# Patient Record
Sex: Female | Born: 1950 | Race: Black or African American | Hispanic: No | Smoking: Current every day smoker
Health system: Southern US, Community
[De-identification: ages and names within clinical notes are randomized; demographics above are authoritative.]

## PROBLEM LIST (undated history)

## (undated) DIAGNOSIS — F0390 Unspecified dementia without behavioral disturbance: Secondary | ICD-10-CM

## (undated) DIAGNOSIS — F419 Anxiety disorder, unspecified: Secondary | ICD-10-CM

## (undated) DIAGNOSIS — M1990 Unspecified osteoarthritis, unspecified site: Secondary | ICD-10-CM

---

## 2015-11-30 ENCOUNTER — Emergency Department (HOSPITAL_BASED_OUTPATIENT_CLINIC_OR_DEPARTMENT_OTHER)
Admission: EM | Admit: 2015-11-30 | Discharge: 2015-11-30 | Disposition: A | Discharge status: Home / Self Care | Payer: Medicare Other | Attending: Emergency Medicine | Admitting: Emergency Medicine

## 2015-11-30 ENCOUNTER — Emergency Department (HOSPITAL_BASED_OUTPATIENT_CLINIC_OR_DEPARTMENT_OTHER): Payer: Medicare Other

## 2015-11-30 ENCOUNTER — Encounter (HOSPITAL_BASED_OUTPATIENT_CLINIC_OR_DEPARTMENT_OTHER): Payer: Self-pay | Admitting: *Deleted

## 2015-11-30 DIAGNOSIS — F17210 Nicotine dependence, cigarettes, uncomplicated: Secondary | ICD-10-CM | POA: Insufficient documentation

## 2015-11-30 DIAGNOSIS — R51 Headache: Secondary | ICD-10-CM | POA: Diagnosis not present

## 2015-11-30 DIAGNOSIS — G43909 Migraine, unspecified, not intractable, without status migrainosus: Secondary | ICD-10-CM | POA: Diagnosis present

## 2015-11-30 DIAGNOSIS — R519 Headache, unspecified: Secondary | ICD-10-CM

## 2015-11-30 HISTORY — DX: Anxiety disorder, unspecified: F41.9

## 2015-11-30 HISTORY — DX: Unspecified osteoarthritis, unspecified site: M19.90

## 2015-11-30 LAB — BASIC METABOLIC PANEL
Anion gap: 6 (ref 5–15)
BUN: 7 mg/dL (ref 6–20)
CHLORIDE: 107 mmol/L (ref 101–111)
CO2: 26 mmol/L (ref 22–32)
Calcium: 8.8 mg/dL — ABNORMAL LOW (ref 8.9–10.3)
Creatinine, Ser: 0.75 mg/dL (ref 0.44–1.00)
GFR calc Af Amer: >60 mL/min (ref >60)
GFR calc non Af Amer: >60 mL/min (ref >60)
GLUCOSE: 90 mg/dL (ref 65–99)
POTASSIUM: 3.4 mmol/L — AB (ref 3.5–5.1)
Sodium: 139 mmol/L (ref 135–145)

## 2015-11-30 LAB — CBC WITH DIFFERENTIAL/PLATELET
Basophils Absolute: 0 10*3/uL (ref 0.0–0.1)
Basophils Relative: 0 %
EOS PCT: 3 %
Eosinophils Absolute: 0.1 10*3/uL (ref 0.0–0.7)
HCT: 38.9 % (ref 36.0–46.0)
Hemoglobin: 12.9 g/dL (ref 12.0–15.0)
LYMPHS ABS: 3.3 10*3/uL (ref 0.7–4.0)
LYMPHS PCT: 61 %
MCH: 33.7 pg (ref 26.0–34.0)
MCHC: 33.2 g/dL (ref 30.0–36.0)
MCV: 101.6 fL — AB (ref 78.0–100.0)
MONO ABS: 0.4 10*3/uL (ref 0.1–1.0)
MONOS PCT: 8 %
Neutro Abs: 1.5 10*3/uL — ABNORMAL LOW (ref 1.7–7.7)
Neutrophils Relative %: 28 %
PLATELETS: 275 10*3/uL (ref 150–400)
RBC: 3.83 MIL/uL — ABNORMAL LOW (ref 3.87–5.11)
RDW: 12.8 % (ref 11.5–15.5)
WBC: 5.4 10*3/uL (ref 4.0–10.5)

## 2015-11-30 LAB — SEDIMENTATION RATE: Sed Rate: 16 mm/hr (ref 0–22)

## 2015-11-30 MED ORDER — ACETAMINOPHEN 325 MG PO TABS
650.0000 mg | ORAL_TABLET | Freq: Once | ORAL | Status: DC
  Filled 2015-11-30: qty 2

## 2015-11-30 MED ORDER — IOPAMIDOL (ISOVUE-370) INJECTION 76%
100.0000 mL | Freq: Once | INTRAVENOUS | Status: AC | PRN
  Administered 2015-11-30: 100 mL via INTRAVENOUS

## 2015-11-30 NOTE — ED Provider Notes (Signed)
[facility]Y [facility] Provider Note   CSN: [id number] Arrival date & time: 11/30/15  1011  First Provider Contact:  First MD Initiated Contact with Patient 11/30/15 1042        History   Chief Complaint Chief Complaint  Patient presents with  . Migraine    HPI Christina Buck is a 65 y.o. female.  Patient is a 65 year old female with a history of arthritis and anxiety. She presents today. She states she was at church today and had a sudden knifelike pain to her right head. She states that it would grab her and then released and then grabbed her and released. She complains of some mild pain right now. She denies any vision changes. No nausea or vomiting. No syncope. No numbness or weakness to her extremities. No pain with chewing. No speech deficits or balance problems. No history of headaches in the past.    Migraine  Associated symptoms include headaches. Pertinent negatives include no chest pain, no abdominal pain and no shortness of breath.    Past Medical History:  Diagnosis Date  . Anxiety   . Arthritis     There are no active problems to display for this patient.   History reviewed. No pertinent surgical history.  OB History    No data available       Home Medications    Prior to Admission medications   Not on File    Family History History reviewed. No pertinent family history.  Social History Social History  Substance Use Topics  . Smoking status: Current Every Day Smoker    Packs/day: 0.50    Years: 25.00    Types: Cigarettes  . Smokeless tobacco: Never Used  . Alcohol use Yes     Comment: occ     Allergies   Review of patient's allergies indicates no known allergies.   Review of Systems Review of Systems  Constitutional: Negative for chills, diaphoresis, fatigue and fever.  HENT: Negative for congestion, rhinorrhea and sneezing.   Eyes: Negative.   Respiratory: Negative for cough, chest tightness and shortness of breath.     Cardiovascular: Negative for chest pain and leg swelling.  Gastrointestinal: Negative for abdominal pain, blood in stool, diarrhea, nausea and vomiting.  Genitourinary: Negative for difficulty urinating, flank pain, frequency and hematuria.  Musculoskeletal: Negative for arthralgias and back pain.  Skin: Negative for rash.  Neurological: Positive for headaches. Negative for dizziness, speech difficulty, weakness and numbness.     Physical Exam Updated Vital Signs BP 142/73   Pulse 74   Temp 98.2 F (36.8 C) (Oral)   Resp 20   Ht  (1.626 m)   Wt 140 lb (63.5 kg)   SpO2 100%   BMI 24.03 kg/m   Physical Exam  Constitutional: She is oriented to person, place, and time. She appears well-developed and well-nourished.  HENT:  Head: Normocephalic and atraumatic.  Positive tenderness over the temporal area on the right. No pain to the jaw.  Eyes: Pupils are equal, round, and reactive to light.  Neck: Normal range of motion. Neck supple.  Cardiovascular: Normal rate, regular rhythm and normal heart sounds.   Pulmonary/Chest: Effort normal and breath sounds normal. No respiratory distress. She has no wheezes. She has no rales. She exhibits no tenderness.  Abdominal: Soft. Bowel sounds are normal. There is no tenderness. There is no rebound and no guarding.  Musculoskeletal: Normal range of motion. She exhibits no edema.  Lymphadenopathy:    She has no  cervical adenopathy.  Neurological: She is alert and oriented to person, place, and time. She has normal strength. No cranial nerve deficit or sensory deficit. GCS eye subscore is 4. GCS verbal subscore is 5. GCS motor subscore is 6.  Skin: Skin is warm and dry. No rash noted.  Psychiatric: She has a normal mood and affect.     ED Treatments / Results  Labs (all labs ordered are listed, but only abnormal results are displayed) Labs Reviewed  BASIC METABOLIC PANEL - Abnormal; Notable for the following:       Result Value    Potassium 3.4 (*)    Calcium 8.8 (*)    All other components within normal limits  CBC WITH DIFFERENTIAL/PLATELET - Abnormal; Notable for the following:    RBC 3.83 (*)    MCV 101.6 (*)    Neutro Abs 1.5 (*)    All other components within normal limits  SEDIMENTATION RATE    EKG  EKG Interpretation None       Radiology Ct Angio Head W/cm &/or Wo Cm  Result Date: 11/30/2015 CLINICAL DATA:  Sharp RIGHT temporal headache this morning after turning head quickly. Symptoms have now abated. Possible confusion. No head injury. EXAM: CT ANGIOGRAPHY HEAD TECHNIQUE: Multidetector CT imaging of the head was performed using the standard protocol during bolus administration of intravenous contrast. Multiplanar CT image reconstructions and MIPs were obtained to evaluate the vascular anatomy. CONTRAST:  100 mL Isovue 370. COMPARISON:  None. FINDINGS: CT HEAD Calvarium and skull base: No fracture or destructive lesion. Mastoids and middle ears are clear. Paranasal sinuses: Imaged portions are clear. Orbits: Negative. Brain: No evidence of acute abnormality, including acute infarct, hemorrhage, hydrocephalus, or mass lesion. Subcentimeter lipoma, asymmetric over the RIGHT midbrain, incidental finding. Exuberant, but incidental falx calcification extends to the LEFT tentorium. Moderate atrophy, with prominence of the ventricles cisterns and sulci. Severe atrophy is noted involving the RIGHT greater than LEFT temporal lobes. Hypoattenuation of white matter, likely chronic microvascular ischemic change. CTA HEAD Anterior circulation: Hypoplastic/atretic RIGHT A1 ACA. Both anterior cerebrals fill from the LEFT. No significant proximal stenosis, proximal occlusion, aneurysm, or vascular malformation. Mild to moderate atheromatous change affects the M3 and M4 vessels bilaterally Posterior circulation: No significant stenosis, proximal occlusion, aneurysm, or vascular malformation. Mild moderate atheromatous change  affects the distal PCA segments. Venous sinuses: Patent Anatomic variants: LEFT fetal PCA Delayed phase:   No abnormal intracranial enhancement. IMPRESSION: No evidence for intracranial aneurysm. No acute intracranial findings. Moderate atrophy, particularly involving the RIGHT greater than LEFT temporal lobes. Electronically Signed   By: Elsie Stain M.D.   On: 11/30/2015 13:30   Procedures Procedures (including critical care time)  Medications Ordered in ED Medications  acetaminophen (TYLENOL) tablet 650 mg (650 mg Oral Not Given 11/30/15 1346)  iopamidol (ISOVUE-370) 76 % injection 100 mL (100 mLs Intravenous Contrast Given 11/30/15 1238)     Initial Impression / Assessment and Plan / ED Course  I have reviewed the triage vital signs and the nursing notes.  Pertinent labs & imaging results that were available during my care of the patient were reviewed by me and considered in my medical decision making (see chart for details).  Clinical Course    Patient presents with a headache. It was sudden onset and unilateral. Since it was concerning for possible subarachnoid hemorrhage, I did do a head CT as well as a CT angiography of the brain. This was negative. There is no evidence of subarachnoid  hemorrhage or meningitis. Her sedimentation rate is normal making it less likely to be temporal arteritis. Patient is currently asymptomatic without treatment. She is alert and oriented but has some bizarre behavior at times. She doesn't seem to be hallucinating but she loses her train of thought and starts talking about other things. I spoke with her son Link Snuffer who lives in [place]. He states that he's noticed some bizarre behavior for the last 2-3 months with intermittent confusion. I advised him to make sure that his mom follows up with her PCP to assess for signs of dementia.  Final Clinical Impressions(s) / ED Diagnoses   Final diagnoses:  Bad headache    New Prescriptions New Prescriptions    No medications on file     Rolan Bucco, MD 11/30/15 1348

## 2015-11-30 NOTE — ED Notes (Signed)
Patient transported to CT 

## 2015-11-30 NOTE — ED Triage Notes (Signed)
Per pt report ongoing headache to lt temporal area. Moderate anxiety present. No other issues.

## 2015-11-30 NOTE — ED Notes (Signed)
Pt has a flight of ideas, hard to stay focused on current problem. Moves all extemties well. Grips equal and strong. Ambulated to bathroom and back with steady gait.

## 2019-04-26 ENCOUNTER — Encounter (HOSPITAL_COMMUNITY): Payer: Self-pay

## 2019-04-26 ENCOUNTER — Other Ambulatory Visit: Payer: Self-pay

## 2019-04-26 ENCOUNTER — Inpatient Hospital Stay (HOSPITAL_COMMUNITY)
Admission: EM | Admit: 2019-04-26 | Discharge: 2019-05-01 | DRG: 177 | Disposition: A | Discharge status: Skilled Nursing Facility | Payer: Medicare Other | Source: Skilled Nursing Facility | Attending: Internal Medicine | Admitting: Internal Medicine

## 2019-04-26 ENCOUNTER — Inpatient Hospital Stay (HOSPITAL_COMMUNITY): Payer: Medicare Other

## 2019-04-26 ENCOUNTER — Emergency Department (HOSPITAL_COMMUNITY): Payer: Medicare Other

## 2019-04-26 DIAGNOSIS — D509 Iron deficiency anemia, unspecified: Secondary | ICD-10-CM | POA: Diagnosis present

## 2019-04-26 DIAGNOSIS — I351 Nonrheumatic aortic (valve) insufficiency: Secondary | ICD-10-CM | POA: Diagnosis not present

## 2019-04-26 DIAGNOSIS — U071 COVID-19: Principal | ICD-10-CM

## 2019-04-26 DIAGNOSIS — Z79899 Other long term (current) drug therapy: Secondary | ICD-10-CM | POA: Diagnosis not present

## 2019-04-26 DIAGNOSIS — Z7982 Long term (current) use of aspirin: Secondary | ICD-10-CM

## 2019-04-26 DIAGNOSIS — K219 Gastro-esophageal reflux disease without esophagitis: Secondary | ICD-10-CM | POA: Diagnosis present

## 2019-04-26 DIAGNOSIS — Z6825 Body mass index (BMI) 25.0-25.9, adult: Secondary | ICD-10-CM

## 2019-04-26 DIAGNOSIS — F0390 Unspecified dementia without behavioral disturbance: Secondary | ICD-10-CM | POA: Diagnosis present

## 2019-04-26 DIAGNOSIS — E43 Unspecified severe protein-calorie malnutrition: Secondary | ICD-10-CM | POA: Diagnosis present

## 2019-04-26 DIAGNOSIS — F419 Anxiety disorder, unspecified: Secondary | ICD-10-CM | POA: Diagnosis present

## 2019-04-26 DIAGNOSIS — J9601 Acute respiratory failure with hypoxia: Secondary | ICD-10-CM | POA: Diagnosis present

## 2019-04-26 DIAGNOSIS — I361 Nonrheumatic tricuspid (valve) insufficiency: Secondary | ICD-10-CM | POA: Diagnosis not present

## 2019-04-26 DIAGNOSIS — G9341 Metabolic encephalopathy: Secondary | ICD-10-CM | POA: Diagnosis present

## 2019-04-26 DIAGNOSIS — J1289 Other viral pneumonia: Secondary | ICD-10-CM | POA: Diagnosis present

## 2019-04-26 DIAGNOSIS — F17210 Nicotine dependence, cigarettes, uncomplicated: Secondary | ICD-10-CM | POA: Diagnosis present

## 2019-04-26 DIAGNOSIS — R945 Abnormal results of liver function studies: Secondary | ICD-10-CM

## 2019-04-26 DIAGNOSIS — I248 Other forms of acute ischemic heart disease: Secondary | ICD-10-CM | POA: Diagnosis present

## 2019-04-26 DIAGNOSIS — R590 Localized enlarged lymph nodes: Secondary | ICD-10-CM | POA: Diagnosis present

## 2019-04-26 DIAGNOSIS — E870 Hyperosmolality and hypernatremia: Secondary | ICD-10-CM | POA: Diagnosis present

## 2019-04-26 DIAGNOSIS — E876 Hypokalemia: Secondary | ICD-10-CM | POA: Diagnosis present

## 2019-04-26 HISTORY — DX: Unspecified dementia, unspecified severity, without behavioral disturbance, psychotic disturbance, mood disturbance, and anxiety: F03.90

## 2019-04-26 LAB — TROPONIN I (HIGH SENSITIVITY)
Troponin I (High Sensitivity): 19 ng/L — ABNORMAL HIGH (ref <18)
Troponin I (High Sensitivity): 19 ng/L — ABNORMAL HIGH (ref <18)

## 2019-04-26 LAB — CBC WITH DIFFERENTIAL/PLATELET
Abs Immature Granulocytes: 0.12 10*3/uL — ABNORMAL HIGH (ref 0.00–0.07)
Basophils Absolute: 0 10*3/uL (ref 0.0–0.1)
Basophils Relative: 0 %
Eosinophils Absolute: 0 10*3/uL (ref 0.0–0.5)
Eosinophils Relative: 1 %
HCT: 34.9 % — ABNORMAL LOW (ref 36.0–46.0)
Hemoglobin: 10.8 g/dL — ABNORMAL LOW (ref 12.0–15.0)
Immature Granulocytes: 2 %
Lymphocytes Relative: 13 %
Lymphs Abs: 1 10*3/uL (ref 0.7–4.0)
MCH: 31.9 pg (ref 26.0–34.0)
MCHC: 30.9 g/dL (ref 30.0–36.0)
MCV: 102.9 fL — ABNORMAL HIGH (ref 80.0–100.0)
Monocytes Absolute: 0.5 10*3/uL (ref 0.1–1.0)
Monocytes Relative: 6 %
Neutro Abs: 6.1 10*3/uL (ref 1.7–7.7)
Neutrophils Relative %: 78 %
Platelets: 394 10*3/uL (ref 150–400)
RBC: 3.39 MIL/uL — ABNORMAL LOW (ref 3.87–5.11)
RDW: 13.1 % (ref 11.5–15.5)
WBC: 7.8 10*3/uL (ref 4.0–10.5)
nRBC: 0.6 % — ABNORMAL HIGH (ref 0.0–0.2)

## 2019-04-26 LAB — COMPREHENSIVE METABOLIC PANEL
ALT: 42 U/L (ref 0–44)
AST: 59 U/L — ABNORMAL HIGH (ref 15–41)
Albumin: 3 g/dL — ABNORMAL LOW (ref 3.5–5.0)
Alkaline Phosphatase: 74 U/L (ref 38–126)
Anion gap: 13 (ref 5–15)
BUN: 18 mg/dL (ref 8–23)
CO2: 24 mmol/L (ref 22–32)
Calcium: 8.6 mg/dL — ABNORMAL LOW (ref 8.9–10.3)
Chloride: 108 mmol/L (ref 98–111)
Creatinine, Ser: 0.76 mg/dL (ref 0.44–1.00)
GFR calc Af Amer: >60 mL/min (ref >60)
GFR calc non Af Amer: >60 mL/min (ref >60)
Glucose, Bld: 108 mg/dL — ABNORMAL HIGH (ref 70–99)
Potassium: 2.9 mmol/L — ABNORMAL LOW (ref 3.5–5.1)
Sodium: 145 mmol/L (ref 135–145)
Total Bilirubin: 1.3 mg/dL — ABNORMAL HIGH (ref 0.3–1.2)
Total Protein: 6.7 g/dL (ref 6.5–8.1)

## 2019-04-26 LAB — LACTIC ACID, PLASMA: Lactic Acid, Venous: 1.1 mmol/L (ref 0.5–1.9)

## 2019-04-26 LAB — LACTATE DEHYDROGENASE: LDH: 418 U/L — ABNORMAL HIGH (ref 98–192)

## 2019-04-26 LAB — FIBRINOGEN: Fibrinogen: 704 mg/dL — ABNORMAL HIGH (ref 210–475)

## 2019-04-26 LAB — ABO/RH: ABO/RH(D): AB POS

## 2019-04-26 LAB — PROCALCITONIN: Procalcitonin: 0.16 ng/mL

## 2019-04-26 LAB — TRIGLYCERIDES: Triglycerides: 89 mg/dL (ref <150)

## 2019-04-26 LAB — D-DIMER, QUANTITATIVE: D-Dimer, Quant: 5.87 ug/mL-FEU — ABNORMAL HIGH (ref 0.00–0.50)

## 2019-04-26 LAB — FERRITIN: Ferritin: 459 ng/mL — ABNORMAL HIGH (ref 11–307)

## 2019-04-26 LAB — C-REACTIVE PROTEIN: CRP: 13.2 mg/dL — ABNORMAL HIGH (ref <1.0)

## 2019-04-26 MED ORDER — ACETAMINOPHEN 325 MG PO TABS
650.0000 mg | ORAL_TABLET | Freq: Four times a day (QID) | ORAL | Status: DC | PRN
  Administered 2019-04-29: 650 mg via ORAL
  Filled 2019-04-26: qty 2

## 2019-04-26 MED ORDER — DEXAMETHASONE SODIUM PHOSPHATE 10 MG/ML IJ SOLN
6.0000 mg | INTRAMUSCULAR | Status: DC
  Administered 2019-04-26 – 2019-04-30 (×5): 6 mg via INTRAVENOUS
  Filled 2019-04-26 (×5): qty 1

## 2019-04-26 MED ORDER — POTASSIUM CHLORIDE IN NACL 20-0.9 MEQ/L-% IV SOLN
INTRAVENOUS | Status: AC
  Filled 2019-04-26: qty 1000

## 2019-04-26 MED ORDER — ENOXAPARIN SODIUM 40 MG/0.4ML ~~LOC~~ SOLN
40.0000 mg | Freq: Every day | SUBCUTANEOUS | Status: DC
  Administered 2019-04-26: 40 mg via SUBCUTANEOUS
  Filled 2019-04-26: qty 0.4

## 2019-04-26 MED ORDER — ENSURE ENLIVE PO LIQD
237.0000 mL | Freq: Two times a day (BID) | ORAL | Status: DC
  Administered 2019-04-27 – 2019-04-30 (×6): 237 mL via ORAL

## 2019-04-26 MED ORDER — IOHEXOL 350 MG/ML SOLN
100.0000 mL | Freq: Once | INTRAVENOUS | Status: AC | PRN
  Administered 2019-04-26: 85 mL via INTRAVENOUS

## 2019-04-26 MED ORDER — POTASSIUM CHLORIDE CRYS ER 20 MEQ PO TBCR
40.0000 meq | EXTENDED_RELEASE_TABLET | Freq: Once | ORAL | Status: AC
  Administered 2019-04-26: 40 meq via ORAL
  Filled 2019-04-26: qty 2

## 2019-04-26 MED ORDER — PRO-STAT SUGAR FREE PO LIQD
30.0000 mL | Freq: Two times a day (BID) | ORAL | Status: DC
  Administered 2019-04-26 – 2019-05-01 (×5): 30 mL via ORAL
  Filled 2019-04-26 (×9): qty 30

## 2019-04-26 MED ORDER — SODIUM CHLORIDE 0.9 % IV SOLN
200.0000 mg | Freq: Once | INTRAVENOUS | Status: AC
  Administered 2019-04-26: 200 mg via INTRAVENOUS
  Filled 2019-04-26: qty 200

## 2019-04-26 MED ORDER — SODIUM CHLORIDE 0.9 % IV SOLN
100.0000 mg | Freq: Every day | INTRAVENOUS | Status: AC
  Administered 2019-04-27 – 2019-04-30 (×4): 100 mg via INTRAVENOUS
  Filled 2019-04-26 (×4): qty 100

## 2019-04-26 NOTE — ED Notes (Signed)
XR at bedside

## 2019-04-26 NOTE — H&P (Addendum)
TRH H&P    Patient Demographics:    Christina Buck, is a 68 y.o. female  MRN: [id number]  DOB - 03-Jul-1950  Admit Date - 04/26/2019  Referring MD/NP/PA:  Davonna Belling  Outpatient Primary MD for the patient is Dineen Kid, MD  Patient coming from:   [place] No  Chief complaint-  fatigue   HPI:    Christina Buck  is a 68 y.o. female,  w severe dementia apparently tested covid-19 positive about 2 weeks ago, presents with fatigue from [facility] and is hypoxic on room air to 89%  In ED,  T 97.9, P 96 R 22, Bp 120/90  Pox 91% on ra, and 89% on RA later.   CTA chest IMPRESSION: 1. Motion degraded study shows no large central pulmonary embolus. Segmental and subsegmental pulmonary arteries to the lower lobes may not be reliably evaluated due to motion artifact. 2. Extensive patchy ground-glass attenuation in both lungs, consistent with the reported clinical history of viral pneumonia. 3. Mediastinal and right hilar lymphadenopathy. Presumably reactive. Follow-up chest CT in 3 months could be used to ensure resolution.  Wbc 7.8, Hgb 10.8, Plt 394 Na 145 K 2.9 Alb 3.0 Ast 59, Alt 42, alk phos 74, T. Bili 1.3 Lactic acid 1.1 D dimer 5.87 procalcitonin 0.16 LDH 418 Fibrinogen 704  Trop 19-> 19  Urinalysis pending Blood culture x2 pending  Pt will be admitted for acute respiratory failure with hypoxia, secondary to covid-19, ? Cap       Review of systems:    In addition to the HPI above, unable to obtain due to dementia  No Fever-chills, No Headache, No changes with Vision or hearing, No problems swallowing food or Liquids, No Chest pain, Cough or Shortness of Breath, No Abdominal pain, No Nausea or Vomiting, bowel movements are regular, No Blood in stool or Urine, No dysuria, No new skin rashes or bruises, No new joints pains-aches,  No new weakness, tingling, numbness in any  extremity, No recent weight gain or loss, No polyuria, polydypsia or polyphagia, No significant Mental Stressors.  All other systems reviewed and are negative.    Past History of the following :    Past Medical History:  Diagnosis Date  . Anxiety   . Arthritis   . Dementia ([place])       History reviewed. No pertinent surgical history.    Social History:      Social History   Tobacco Use  . Smoking status: Current Every Day Smoker    Packs/day: 0.50    Years: 25.00    Pack years: 12.50    Types: Cigarettes  . Smokeless tobacco: Never Used  Substance Use Topics  . Alcohol use: Yes    Comment: occ       Family History :    No family history on file. Unable to obtain due to dementia   Home Medications:   Prior to Admission medications   Medication Sig Start Date End Date Taking? Authorizing Provider  acetaminophen (TYLENOL) 500 MG tablet Take 500  mg by mouth every 6 (six) hours as needed for pain. 02/23/18  Yes [provider]  aspirin 325 MG tablet Take 325 mg by mouth daily.   Yes [provider]  ferrous sulfate 325 (65 FE) MG EC tablet Take 325 mg by mouth daily before breakfast. 11/29/17  Yes [provider]  furosemide (LASIX) 20 MG tablet Take 20 mg by mouth daily. 08/31/18  Yes [provider]  hydrOXYzine (ATARAX/VISTARIL) 10 MG tablet Take 10 mg by mouth every 8 (eight) hours as needed for anxiety.   Yes [provider]  Melatonin 3 MG TABS Take 3 mg by mouth at bedtime. 09/06/18  Yes [provider]  omeprazole (PRILOSEC) 20 MG capsule Take 20 mg by mouth daily.   Yes [provider]  potassium chloride (KLOR-CON) 10 MEQ tablet Take 10 mEq by mouth daily. 08/31/18  Yes [provider]  sertraline (ZOLOFT) 50 MG tablet Take 50 mg by mouth daily.   Yes [provider]  traZODone (DESYREL) 50 MG tablet Take 50 mg by mouth at bedtime. 11/09/18   [provider]      Allergies:    No Known Allergies   Physical Exam:   Vitals  Blood pressure 111/75, pulse 98, temperature 97.9 F (36.6 C), temperature source Oral, resp. rate (!) 22, SpO2 96 %.  1.  General: axox1 (person)  2. Psychiatric: euthymic  3. Neurologic: Nonfocal, cn2-12 intact, reflexes 2+ symmetric, diffuse with no clonus,  Pt is able to move all 4 ext to painful stimulli  4. HEENMT:  Anicteric, pupils 1.75m symmetric, direct, consensual, near intact Neck: no jvd  5. Respiratory : Bibasilar crackles, no wheezing  6. Cardiovascular : rrr s1, s2, no m/g/r,   7. Gastrointestinal:  Abd: soft, nt, nd, +bs  8. Skin:  Ext: no c/c/e, no rash  9.Musculoskeletal:  Good ROM    Data Review:    CBC Recent Labs  Lab 04/26/19 1657  WBC 7.8  HGB 10.8*  HCT 34.9*  PLT 394  MCV 102.9*  MCH 31.9  MCHC 30.9  RDW 13.1  LYMPHSABS 1.0  MONOABS 0.5  EOSABS 0.0  BASOSABS 0.0   ------------------------------------------------------------------------------------------------------------------  Results for orders placed or performed during the hospital encounter of 04/26/19 (from the past 48 hour(s))  Lactic acid, plasma     Status: None   Collection Time: 04/26/19  4:32 PM  Result Value Ref Range   Lactic Acid, Venous 1.1 0.5 - 1.9 mmol/L    Comment: Performed at [facility] [address], [id number] Ferritin     Status: Abnormal   Collection Time: 04/26/19  4:33 PM  Result Value Ref Range   Ferritin 459 (H) 11 - 307 ng/mL    Comment: Performed at [facility] [address] [address] [place] [address] Triglycerides     Status: None   Collection Time: 04/26/19  4:33 PM  Result Value Ref Range   Triglycerides 89 <150 mg/dL    Comment: Performed at [facility] [address] [place] [address] C-reactive protein     Status: Abnormal   Collection Time: 04/26/19  4:33 PM  Result Value  Ref Range   CRP 13.2 (H) <1.0 mg/dL    Comment: Performed at [facility] [address] [place] [id number] CBC WITH DIFFERENTIAL     Status: Abnormal   Collection Time: 04/26/19  4:57  PM  Result Value Ref Range   WBC 7.8 4.0 - 10.5 K/uL   RBC 3.39 (L) 3.87 - 5.11 MIL/uL   Hemoglobin 10.8 (L) 12.0 - 15.0 g/dL   HCT 34.9 (L) 36.0 - 46.0 %   MCV 102.9 (H) 80.0 - 100.0 fL   MCH 31.9 26.0 - 34.0 pg   MCHC 30.9 30.0 - 36.0 g/dL   RDW 13.1 11.5 - 15.5 %   Platelets 394 150 - 400 K/uL   nRBC 0.6 (H) 0.0 - 0.2 %   Neutrophils Relative % 78 %   Neutro Abs 6.1 1.7 - 7.7 K/uL   Lymphocytes Relative 13 %   Lymphs Abs 1.0 0.7 - 4.0 K/uL   Monocytes Relative 6 %   Monocytes Absolute 0.5 0.1 - 1.0 K/uL   Eosinophils Relative 1 %   Eosinophils Absolute 0.0 0.0 - 0.5 K/uL   Basophils Relative 0 %   Basophils Absolute 0.0 0.0 - 0.1 K/uL   Immature Granulocytes 2 %   Abs Immature Granulocytes 0.12 (H) 0.00 - 0.07 K/uL    Comment: Performed at [facility], [place] [address] [place], [place] [address]  Comprehensive metabolic panel     Status: Abnormal   Collection Time: 04/26/19  4:57 PM  Result Value Ref Range   Sodium 145 135 - 145 mmol/L   Potassium 2.9 (L) 3.5 - 5.1 mmol/L   Chloride 108 98 - 111 mmol/L   CO2 24 22 - 32 mmol/L   Glucose, Bld 108 (H) 70 - 99 mg/dL   BUN 18 8 - 23 mg/dL   Creatinine, Ser 0.76 0.44 - 1.00 mg/dL   Calcium 8.6 (L) 8.9 - 10.3 mg/dL   Total Protein 6.7 6.5 - 8.1 g/dL   Albumin 3.0 (L) 3.5 - 5.0 g/dL   AST 59 (H) 15 - 41 U/L   ALT 42 0 - 44 U/L   Alkaline Phosphatase 74 38 - 126 U/L   Total Bilirubin 1.3 (H) 0.3 - 1.2 mg/dL   GFR calc non Af Amer >60 >60 mL/min   GFR calc Af Amer >60 >60 mL/min   Anion gap 13 5 - 15    Comment: Performed at [place], [place] [address], [place], [place] [address]  D-dimer, quantitative     Status: Abnormal   Collection Time: 04/26/19  4:57 PM  Result Value  Ref Range   D-Dimer, Quant 5.87 (H) 0.00 - 0.50 ug/mL-FEU    Comment: (NOTE) At the manufacturer cut-off of 0.50 ug/mL FEU, this assay has been documented to exclude PE with a sensitivity and negative predictive value of 97 to 99%.  At this time, this assay has not been approved by the FDA to exclude DVT/VTE. Results should be correlated with clinical presentation. Performed at [facility], [place] [address], [place], [place] [address]   Procalcitonin     Status: None   Collection Time: 04/26/19  4:57 PM  Result Value Ref Range   Procalcitonin 0.16 ng/mL    Comment:        Interpretation: PCT (Procalcitonin) <= 0.5 ng/mL: Systemic infection (sepsis) is not likely. Local bacterial infection is possible. (NOTE)       Sepsis PCT Algorithm           Lower Respiratory Tract  Infection PCT Algorithm    ----------------------------     ----------------------------         PCT < 0.25 ng/mL                PCT < 0.10 ng/mL         Strongly encourage             Strongly discourage   discontinuation of antibiotics    initiation of antibiotics    ----------------------------     -----------------------------       PCT 0.25 - 0.50 ng/mL            PCT 0.10 - 0.25 ng/mL               OR       >80% decrease in PCT            Discourage initiation of                                            antibiotics      Encourage discontinuation           of antibiotics    ----------------------------     -----------------------------         PCT >= 0.50 ng/mL              PCT 0.26 - 0.50 ng/mL               AND        <80% decrease in PCT             Encourage initiation of                                             antibiotics       Encourage continuation           of antibiotics    ----------------------------     -----------------------------        PCT >= 0.50 ng/mL                  PCT > 0.50 ng/mL               AND         increase in PCT                   Strongly encourage                                      initiation of antibiotics    Strongly encourage escalation           of antibiotics                                     -----------------------------                                           PCT <= 0.25 ng/mL  OR                                        > 80% decrease in PCT                                     Discontinue / Do not initiate                                             antibiotics Performed at [place] [address], [place], [place] [address]   Lactate dehydrogenase     Status: Abnormal   Collection Time: 04/26/19  4:57 PM  Result Value Ref Range   LDH 418 (H) 98 - 192 U/L    Comment: Performed at [facility], [place] [address] [place], [place] [address]  Fibrinogen     Status: Abnormal   Collection Time: 04/26/19  4:57 PM  Result Value Ref Range   Fibrinogen 704 (H) 210 - 475 mg/dL    Comment: Performed at [facility], [address] [place], [place] [address]    Chemistries  Recent Labs  Lab 04/26/19 1657  NA 145  K 2.9*  CL 108  CO2 24  GLUCOSE 108*  BUN 18  CREATININE 0.76  CALCIUM 8.6*  AST 59*  ALT 42  ALKPHOS 74  BILITOT 1.3*   ------------------------------------------------------------------------------------------------------------------  ------------------------------------------------------------------------------------------------------------------ GFR: CrCl cannot be calculated (Unknown ideal weight.). Liver Function Tests: Recent Labs  Lab 04/26/19 1657  AST 59*  ALT 42  ALKPHOS 74  BILITOT 1.3*  PROT 6.7  ALBUMIN 3.0*   No results for input(s): LIPASE, AMYLASE in the last 168 hours. No results for input(s): AMMONIA in the last 168 hours. Coagulation Profile: No results for input(s): INR, PROTIME in the last 168 hours. Cardiac Enzymes: No results  for input(s): CKTOTAL, CKMB, CKMBINDEX, TROPONINI in the last 168 hours. BNP (last 3 results) No results for input(s): PROBNP in the last 8760 hours. HbA1C: No results for input(s): HGBA1C in the last 72 hours. CBG: No results for input(s): GLUCAP in the last 168 hours. Lipid Profile: Recent Labs    04/26/19 1633  TRIG 89   Thyroid Function Tests: No results for input(s): TSH, T4TOTAL, FREET4, T3FREE, THYROIDAB in the last 72 hours. Anemia Panel: Recent Labs    04/26/19 1633  FERRITIN 459*    --------------------------------------------------------------------------------------------------------------- Urine analysis: No results found for: COLORURINE, APPEARANCEUR, LABSPEC, PHURINE, GLUCOSEU, HGBUR, BILIRUBINUR, KETONESUR, PROTEINUR, UROBILINOGEN, NITRITE, LEUKOCYTESUR    Imaging Results:    DG Chest Portable 1 View  Result Date: 04/26/2019 CLINICAL DATA:  Altered mental status, COVID-19, fatigue, dementia EXAM: PORTABLE CHEST 1 VIEW COMPARISON:  Portable exam 1507 hours without priors for comparison FINDINGS: Enlargement of cardiac silhouette. Atherosclerotic calcification aorta. Mediastinal contours normal. Patchy airspace infiltrates in mid to lower lungs bilaterally consistent with multifocal pneumonia and history of COVID-19. No pleural effusion or pneumothorax. Bones demineralized. IMPRESSION: Patchy BILATERAL pulmonary infiltrates at mid to lower lungs consistent with multifocal pneumonia and history of COVID-19. Mild enlargement of cardiac silhouette. Aortic Atherosclerosis (ICD10-I70.0). Electronically Signed   By: Lavonia Dana M.D.   On: 04/26/2019 15:41  ekg nsr at 95, nl axis, nl int, RBBB, no st-t changes c/w ischemia   Assessment & Plan:    Principal Problem:   Acute respiratory failure with hypoxia (HCC) Active Problems:   COVID-19 virus infection   Protein-calorie malnutrition, severe (HCC)   Hypokalemia  Acute respiratory failure with  hypoxia Secondary to covid-19 infection, vs pneumonia  Covid -19 infection covid-19 pcr Dexamethasone 28m iv qday Remdesivir pharmacy consult  Pneumonia Blood culture x2 Urine strep antigen Urine legionella antigen Rocephin 1gm iv qday, Zithromax 5053miv qday  Hypokalemia Replete Check cmp in am  Abnromal liver function Check acute hepatitis panel Check RUQ ultrasound Check cmp in am  Elevated troponin likely demand ischemia Check trop I , if trending upwards please consult cardiology Check cardiac echo  R mediastinal / hilar adenopathy Please repeat CT chest in 3 months per radiology recommendations  Severe protein calorie malnutrition Start Pro-stat 3087mo bid  Anemia Cont ferrous sulfate Check cbc in am  Gerd Cont PPI  Dementia, severe/  Anxiety Cont Zoloft 77m11m qday Cont Trazodone 77mg59mqhs  ? Edema Cont Lasix 20mg 77mqday Cont Kcl 10 meq po qday  DVT Prophylaxis-   Lovenox - SCDs   AM Labs Ordered, also please review Full Orders  Family Communication: Admission, patients condition and plan of care including tests being ordered have been discussed with the patient  who indicate understanding and agree with the plan and Code Status.  Code Status:  FULL CODE , please contact facility in [place] to verify Code status and to obtain phone number of son   I spoke with friend listed in computer, pt does have a son but she did not know his number  Admission status: Inpatient: Based on patients clinical presentation and evaluation of above clinical data, I have made determination that patient meets Inpatient criteria at this time.  Pt will require iv steroids and remdesivir for covid-19, pt will require iv abx, in case this is bacterial pnuemonia.  Pt has high risk of clinical deterioration , pt will require > 2 nites stay  Time spent in minutes : 70 minutes   Tosh Glaze Jani Graveln 04/26/2019 at 7:26 PM

## 2019-04-26 NOTE — ED Provider Notes (Signed)
[facility]-EMERGENCY DEPT Provider Note   CSN: [id number] Arrival date & time: 04/26/19  1425     History Chief Complaint  Patient presents with  . COVID+    Christina Buck is a 68 y.o. female.  HPI Level 5 caveat due to dementia. Patient sent from nursing home.  Covid +2 weeks ago.  Reportedly more fatigued.  Reported baseline dementia.  But has been more fatigued.  Patient really cannot provide much history.    Past Medical History:  Diagnosis Date  . Anxiety   . Arthritis   . Dementia (HCC)     There are no problems to display for this patient.   History reviewed. No pertinent surgical history.   OB History   No obstetric history on file.     No family history on file.  Social History   Tobacco Use  . Smoking status: Current Every Day Smoker    Packs/day: 0.50    Years: 25.00    Pack years: 12.50    Types: Cigarettes  . Smokeless tobacco: Never Used  Substance Use Topics  . Alcohol use: Yes    Comment: occ  . Drug use: No    Home Medications Prior to Admission medications   Medication Sig Start Date End Date Taking? Authorizing Provider  acetaminophen (TYLENOL) 500 MG tablet Take 500 mg by mouth every 6 (six) hours as needed for pain. 02/23/18  Yes [provider]  aspirin 325 MG tablet Take 325 mg by mouth daily.   Yes [provider]  ferrous sulfate 325 (65 FE) MG EC tablet Take 325 mg by mouth daily before breakfast. 11/29/17  Yes [provider]  furosemide (LASIX) 20 MG tablet Take 20 mg by mouth daily. 08/31/18  Yes [provider]  hydrOXYzine (ATARAX/VISTARIL) 10 MG tablet Take 10 mg by mouth every 8 (eight) hours as needed for anxiety.   Yes [provider]  Melatonin 3 MG TABS Take 3 mg by mouth at bedtime. 09/06/18  Yes [provider]  omeprazole (PRILOSEC) 20 MG capsule Take 20 mg by mouth daily.   Yes [provider]  potassium chloride (KLOR-CON) 10 MEQ  tablet Take 10 mEq by mouth daily. 08/31/18  Yes [provider]  sertraline (ZOLOFT) 50 MG tablet Take 50 mg by mouth daily.   Yes [provider]  traZODone (DESYREL) 50 MG tablet Take 50 mg by mouth at bedtime. 11/09/18   [provider]    Allergies    Patient has no known allergies.  Review of Systems   Review of Systems  Unable to perform ROS: Dementia    Physical Exam Updated Vital Signs BP 115/67   Pulse 98   Temp 97.9 F (36.6 C) (Oral)   Resp (!) 24   SpO2 92%   Physical Exam Vitals and nursing note reviewed.  Constitutional:      Comments: Sitting in bed with eyes closed.  Will arouse somewhat but really unable to answer questions.  HENT:     Mouth/Throat:     Mouth: Mucous membranes are moist.  Cardiovascular:     Rate and Rhythm: Regular rhythm.  Pulmonary:     Comments: Mildly harsh breath sounds.  Tachypnea. Abdominal:     Tenderness: There is no abdominal tenderness.  Musculoskeletal:     Cervical back: Neck supple.     Right lower leg: No edema.     Left lower leg: No edema.  Skin:  General: Skin is warm.     Capillary Refill: Capillary refill takes less than 2 seconds.  Neurological:     Comments: Awake and will look to me but really not answer questions or follow commands.     ED Results / Procedures / Treatments   Labs (all labs ordered are listed, but only abnormal results are displayed) Labs Reviewed  CBC WITH DIFFERENTIAL/PLATELET - Abnormal; Notable for the following components:      Result Value   RBC 3.39 (*)    Hemoglobin 10.8 (*)    HCT 34.9 (*)    MCV 102.9 (*)    nRBC 0.6 (*)    Abs Immature Granulocytes 0.12 (*)    All other components within normal limits  COMPREHENSIVE METABOLIC PANEL - Abnormal; Notable for the following components:   Potassium 2.9 (*)    Glucose, Bld 108 (*)    Calcium 8.6 (*)    Albumin 3.0 (*)    AST 59 (*)    Total Bilirubin 1.3 (*)    All other components within normal  limits  D-DIMER, QUANTITATIVE (NOT AT [facility]) - Abnormal; Notable for the following components:   D-Dimer, Quant 5.87 (*)    All other components within normal limits  LACTATE DEHYDROGENASE - Abnormal; Notable for the following components:   LDH 418 (*)    All other components within normal limits  FERRITIN - Abnormal; Notable for the following components:   Ferritin 459 (*)    All other components within normal limits  FIBRINOGEN - Abnormal; Notable for the following components:   Fibrinogen 704 (*)    All other components within normal limits  C-REACTIVE PROTEIN - Abnormal; Notable for the following components:   CRP 13.2 (*)    All other components within normal limits  CULTURE, BLOOD (ROUTINE X 2)  CULTURE, BLOOD (ROUTINE X 2)  LACTIC ACID, PLASMA  PROCALCITONIN  TRIGLYCERIDES  URINALYSIS, ROUTINE W REFLEX MICROSCOPIC    EKG EKG Interpretation  Date/Time:  Thursday April 26 2019 14:46:03 EST Ventricular Rate:  97 PR Interval:    QRS Duration: 154 QT Interval:  396 QTC Calculation: 504 R Axis:   30 Text Interpretation: Sinus rhythm Right bundle branch block Baseline wander in lead(s) V2 Confirmed by Davonna Belling [id number]) on 04/26/2019 6:55:43 PM   Radiology DG Chest Portable 1 View  Result Date: 04/26/2019 CLINICAL DATA:  Altered mental status, COVID-19, fatigue, dementia EXAM: PORTABLE CHEST 1 VIEW COMPARISON:  Portable exam 1507 hours without priors for comparison FINDINGS: Enlargement of cardiac silhouette. Atherosclerotic calcification aorta. Mediastinal contours normal. Patchy airspace infiltrates in mid to lower lungs bilaterally consistent with multifocal pneumonia and history of COVID-19. No pleural effusion or pneumothorax. Bones demineralized. IMPRESSION: Patchy BILATERAL pulmonary infiltrates at mid to lower lungs consistent with multifocal pneumonia and history of COVID-19. Mild enlargement of cardiac silhouette. Aortic Atherosclerosis (ICD10-I70.0).  Electronically Signed   By: Lavonia Dana M.D.   On: 04/26/2019 15:41    Procedures Procedures (including critical care time)  Medications Ordered in ED Medications - No data to display  ED Course  I have reviewed the triage vital signs and the nursing notes.  Pertinent labs & imaging results that were available during my care of the patient were reviewed by me and considered in my medical decision making (see chart for details).    MDM Rules/Calculators/A&P                      Patient with  shortness of breath.  X-ray shows Covid-like pneumonia.  Known Covid infection.  Sats 90% on room air.  Requires oxygen.  Will admit to hospitalist Final Clinical Impression(s) / ED Diagnoses Final diagnoses:  COVID-19    Rx / DC Orders ED Discharge Orders    None       Benjiman CorePickering, Randal Goens, MD 04/26/19 1858

## 2019-04-26 NOTE — ED Triage Notes (Addendum)
Pt was dx with COVID 2 weeks ago. Christina Buck's states that pt is more fatigued than normal, but pt is awake and alert and singing. Pt is at baseline mentation (oriented to self only).

## 2019-04-26 NOTE — ED Notes (Signed)
Pt placed on 2L [place]

## 2019-04-27 ENCOUNTER — Inpatient Hospital Stay (HOSPITAL_COMMUNITY): Payer: Medicare Other

## 2019-04-27 DIAGNOSIS — J9601 Acute respiratory failure with hypoxia: Secondary | ICD-10-CM

## 2019-04-27 DIAGNOSIS — I361 Nonrheumatic tricuspid (valve) insufficiency: Secondary | ICD-10-CM

## 2019-04-27 DIAGNOSIS — I351 Nonrheumatic aortic (valve) insufficiency: Secondary | ICD-10-CM

## 2019-04-27 LAB — CBC WITH DIFFERENTIAL/PLATELET
Abs Immature Granulocytes: 0.12 10*3/uL — ABNORMAL HIGH (ref 0.00–0.07)
Basophils Absolute: 0 10*3/uL (ref 0.0–0.1)
Basophils Relative: 0 %
Eosinophils Absolute: 0 10*3/uL (ref 0.0–0.5)
Eosinophils Relative: 0 %
HCT: 33.8 % — ABNORMAL LOW (ref 36.0–46.0)
Hemoglobin: 10.3 g/dL — ABNORMAL LOW (ref 12.0–15.0)
Immature Granulocytes: 2 %
Lymphocytes Relative: 13 %
Lymphs Abs: 0.8 10*3/uL (ref 0.7–4.0)
MCH: 32.2 pg (ref 26.0–34.0)
MCHC: 30.5 g/dL (ref 30.0–36.0)
MCV: 105.6 fL — ABNORMAL HIGH (ref 80.0–100.0)
Monocytes Absolute: 0.2 10*3/uL (ref 0.1–1.0)
Monocytes Relative: 4 %
Neutro Abs: 4.7 10*3/uL (ref 1.7–7.7)
Neutrophils Relative %: 81 %
Platelets: 375 10*3/uL (ref 150–400)
RBC: 3.2 MIL/uL — ABNORMAL LOW (ref 3.87–5.11)
RDW: 13.2 % (ref 11.5–15.5)
WBC: 5.8 10*3/uL (ref 4.0–10.5)
nRBC: 0.7 % — ABNORMAL HIGH (ref 0.0–0.2)

## 2019-04-27 LAB — COMPREHENSIVE METABOLIC PANEL
ALT: 48 U/L — ABNORMAL HIGH (ref 0–44)
AST: 67 U/L — ABNORMAL HIGH (ref 15–41)
Albumin: 3 g/dL — ABNORMAL LOW (ref 3.5–5.0)
Alkaline Phosphatase: 68 U/L (ref 38–126)
Anion gap: 11 (ref 5–15)
BUN: 18 mg/dL (ref 8–23)
CO2: 23 mmol/L (ref 22–32)
Calcium: 8.3 mg/dL — ABNORMAL LOW (ref 8.9–10.3)
Chloride: 115 mmol/L — ABNORMAL HIGH (ref 98–111)
Creatinine, Ser: 0.69 mg/dL (ref 0.44–1.00)
GFR calc Af Amer: >60 mL/min (ref >60)
GFR calc non Af Amer: >60 mL/min (ref >60)
Glucose, Bld: 120 mg/dL — ABNORMAL HIGH (ref 70–99)
Potassium: 3.9 mmol/L (ref 3.5–5.1)
Sodium: 149 mmol/L — ABNORMAL HIGH (ref 135–145)
Total Bilirubin: 1.2 mg/dL (ref 0.3–1.2)
Total Protein: 6.7 g/dL (ref 6.5–8.1)

## 2019-04-27 LAB — URINALYSIS, ROUTINE W REFLEX MICROSCOPIC
Bilirubin Urine: NEGATIVE
Glucose, UA: NEGATIVE mg/dL
Hgb urine dipstick: NEGATIVE
Ketones, ur: 5 mg/dL — AB
Nitrite: NEGATIVE
Protein, ur: 30 mg/dL — AB
Specific Gravity, Urine: >1.046 — ABNORMAL HIGH (ref 1.005–1.030)
pH: 5 (ref 5.0–8.0)

## 2019-04-27 LAB — HEPATITIS PANEL, ACUTE
HCV Ab: NONREACTIVE
Hep A IgM: NONREACTIVE
Hep B C IgM: NONREACTIVE
Hepatitis B Surface Ag: NONREACTIVE

## 2019-04-27 LAB — CK TOTAL AND CKMB (NOT AT ARMC)
CK, MB: 7 ng/mL — ABNORMAL HIGH (ref 0.5–5.0)
Relative Index: 1.1 (ref 0.0–2.5)
Total CK: 627 U/L — ABNORMAL HIGH (ref 38–234)

## 2019-04-27 LAB — ECHOCARDIOGRAM COMPLETE
Height: 64 in
Weight: 2356.28 oz

## 2019-04-27 LAB — HIV ANTIBODY (ROUTINE TESTING W REFLEX): HIV Screen 4th Generation wRfx: NONREACTIVE

## 2019-04-27 LAB — SARS CORONAVIRUS 2 (TAT 6-24 HRS): SARS Coronavirus 2: POSITIVE — AB

## 2019-04-27 LAB — STREP PNEUMONIAE URINARY ANTIGEN: Strep Pneumo Urinary Antigen: NEGATIVE

## 2019-04-27 LAB — TROPONIN I (HIGH SENSITIVITY): Troponin I (High Sensitivity): 17 ng/L (ref <18)

## 2019-04-27 MED ORDER — SODIUM CHLORIDE 0.9 % IV SOLN
500.0000 mg | Freq: Every day | INTRAVENOUS | Status: DC
  Administered 2019-04-27 – 2019-04-30 (×4): 500 mg via INTRAVENOUS
  Filled 2019-04-27 (×4): qty 500

## 2019-04-27 MED ORDER — FERROUS SULFATE 325 (65 FE) MG PO TABS
325.0000 mg | ORAL_TABLET | Freq: Every day | ORAL | Status: DC
  Administered 2019-04-27 – 2019-05-01 (×3): 325 mg via ORAL
  Filled 2019-04-27 (×5): qty 1

## 2019-04-27 MED ORDER — PANTOPRAZOLE SODIUM 40 MG PO TBEC
40.0000 mg | DELAYED_RELEASE_TABLET | Freq: Every day | ORAL | Status: DC
  Administered 2019-04-27 – 2019-05-01 (×4): 40 mg via ORAL
  Filled 2019-04-27 (×5): qty 1

## 2019-04-27 MED ORDER — ENOXAPARIN SODIUM 40 MG/0.4ML ~~LOC~~ SOLN
40.0000 mg | Freq: Two times a day (BID) | SUBCUTANEOUS | Status: DC
  Administered 2019-04-27 – 2019-05-01 (×8): 40 mg via SUBCUTANEOUS
  Filled 2019-04-27 (×8): qty 0.4

## 2019-04-27 MED ORDER — MELATONIN 3 MG PO TABS
3.0000 mg | ORAL_TABLET | Freq: Every day | ORAL | Status: DC
  Administered 2019-04-27 – 2019-04-29 (×2): 3 mg via ORAL
  Filled 2019-04-27 (×4): qty 1

## 2019-04-27 MED ORDER — SERTRALINE HCL 50 MG PO TABS
50.0000 mg | ORAL_TABLET | Freq: Every day | ORAL | Status: DC
  Administered 2019-04-27 – 2019-05-01 (×4): 50 mg via ORAL
  Filled 2019-04-27 (×4): qty 1

## 2019-04-27 MED ORDER — ASPIRIN 325 MG PO TABS
325.0000 mg | ORAL_TABLET | Freq: Every day | ORAL | Status: DC
  Administered 2019-04-27 – 2019-05-01 (×4): 325 mg via ORAL
  Filled 2019-04-27 (×4): qty 1

## 2019-04-27 MED ORDER — SODIUM CHLORIDE 0.9 % IV SOLN
1.0000 g | Freq: Every day | INTRAVENOUS | Status: DC
  Administered 2019-04-27 – 2019-04-30 (×5): 1 g via INTRAVENOUS
  Filled 2019-04-27 (×2): qty 1
  Filled 2019-04-27: qty 10
  Filled 2019-04-27 (×3): qty 1

## 2019-04-27 MED ORDER — HYDROXYZINE HCL 10 MG PO TABS
10.0000 mg | ORAL_TABLET | Freq: Three times a day (TID) | ORAL | Status: DC | PRN
  Administered 2019-04-28 – 2019-05-01 (×5): 10 mg via ORAL
  Filled 2019-04-27 (×6): qty 1

## 2019-04-27 MED ORDER — FUROSEMIDE 20 MG PO TABS: 20.0000 mg | ORAL_TABLET | Freq: Every day | ORAL | Status: DC

## 2019-04-27 MED ORDER — POTASSIUM CHLORIDE CRYS ER 10 MEQ PO TBCR
10.0000 meq | EXTENDED_RELEASE_TABLET | Freq: Every day | ORAL | Status: DC
  Administered 2019-04-27 – 2019-05-01 (×4): 10 meq via ORAL
  Filled 2019-04-27 (×4): qty 1

## 2019-04-27 MED ORDER — TRAZODONE HCL 50 MG PO TABS
50.0000 mg | ORAL_TABLET | Freq: Every day | ORAL | Status: DC
  Administered 2019-04-27 – 2019-04-29 (×2): 50 mg via ORAL
  Filled 2019-04-27 (×4): qty 1

## 2019-04-27 MED ORDER — DEXTROSE-NACL 5-0.45 % IV SOLN: INTRAVENOUS | Status: DC

## 2019-04-27 NOTE — Progress Notes (Signed)
This RN called patient's emergency contact, Kyra Manges (pt's friend), to obtain baseline history of patient. Kyra Manges stated she had not seen pt in 2 years and did not have son's or any other family's information. Also attempted to call [facility] facility to get son's information. No answer after multiple attempts.

## 2019-04-27 NOTE — Progress Notes (Signed)
PROGRESS NOTE  Ninfa Geophysicist/field seismologist  DOB: [birth year]  PCP: Dineen Kid, MD WNI:[id number]  DOA: 04/26/2019  LOS: 1 day   Chief Complaint  Patient presents with  . COVID+   Brief narrative: Christina Buck is a 68 y.o. female,  with severe dementia apparently who tested covid-19 positive about 2 weeks prior to presentation. Patient presented to the ED on 12/24 with fatigue from [facility]. In the ED, patient was 89% on room air, afebrile.  COVID-19 PCR positive. CTA chest showed extensive patchy ground-glass attenuation in both lungs, consistent with the reported clinical history of viral pneumonia. Blood work showed WBC normal at 7.8, lactic acid normal at 1.1, procalcitonin 0.16 Pt was admitted for acute respiratory failure with hypoxia, secondary to covid-19  Subjective: Patient was seen and examined this morning.  Pleasant elderly African-American female.  Seems to have baseline dementia.  She was cheerful but not able to answer questions to me.  Not in physical distress.  Assessment/Plan: COVID pneumonia Acute respiratory failure with hypoxia -Presented with fatigue, hypoxia -CTA chest showed extensive patchy groundglass attenuation in both lungs -Treatment: 10-day course of IV dexamethasone 6 mg and 5-day course of IV remdesivir today none 12/28  -Supportive care: Vitamin C, Zinc, inhalers, Tylenol, Antitussives -benzonatate, Mucinex -Oxygen -not on supplemental oxygen at home.  89% room air on arrival.  Currently on low-flow supplemental oxygen. -Labs and biomarker trend as below.  Lab Results  Component Value Date   SARSCOV2NAA POSITIVE (A) 04/26/2019    Recent Labs  Lab 04/26/19 1657 04/27/19 0328  WBC 7.8 5.8   Recent Labs    04/26/19 1633 04/26/19 1657  DDIMER  --  5.87*  FERRITIN 459*  --   LDH  --  418*  CRP 13.2*  --    Community-acquired pneumonia -Initial procalcitonin level on admission was slightly elevated at 0.16. -With suspicion of secondary  bacterial infection, patient was started on Rocephin 1gm iv qday, Zithromax 500mg  iv qday which we will continue.  Acute hypernatremia -sodium level elevated to 149, chloride level elevated to 115. -I switched her IV fluids to D5 half NS this morning.  Repeat labs tomorrow.  Acute metabolic encephalopathy Dementia, severe/  Anxiety -Patient is cheerful but not able to answer any question appropriately.  She seems very confused.  She has a history of dementia.  Additionally, acutely she has Covid pneumonia as well as hyponatremia.  Expect some improvement in mental status back to baseline.  For now cont Zoloft 50mg  po qday Cont Trazodone 50mg  po qhs  Abnromal liver function Negative acute hepatitis panel Normal RUQ ultrasound Repeat cmp in am  Elevated troponin  -Only slightly elevated troponin.  Likely 1 ischemia.  Echo with EF 60 to 65%, no WMA  R mediastinal / hilar adenopathy Please repeat CT chest in 3 months per radiology recommendations  Severe protein calorie malnutrition Start Pro-stat 53mL po bid  Chronic anemia Cont ferrous sulfate Repeat cbc in am  GERD Cont PPI  ? Peripheral edema Cont Lasix 20mg   Po qday Cont Kcl 10 meq po qday  Mobility: Encourage ambulation.  PT eval ordered. Diet: Dysphagia 1 diet.  Speech therapy eval Fluid: D5 half NS at 50 mL/h DVT prophylaxis:  Lovenox subcu Code Status:  Full code Family Communication:  Not at bedside Expected Discharge:  Remdesivir to continue till 12/28.  Consultants:  None  Procedures:  None  Antimicrobials: Anti-infectives (From admission, onward)   Start     Dose/Rate Route Frequency Ordered Stop  04/27/19 1000  remdesivir 100 mg in sodium chloride 0.9 % 100 mL IVPB     100 mg 200 mL/hr over 30 Minutes Intravenous Daily 04/26/19 1919 05/01/19 0959   04/27/19 0200  azithromycin (ZITHROMAX) 500 mg in sodium chloride 0.9 % 250 mL IVPB     500 mg 250 mL/hr over 60 Minutes Intravenous Daily  04/27/19 0052     04/27/19 0100  cefTRIAXone (ROCEPHIN) 1 g in sodium chloride 0.9 % 100 mL IVPB     1 g 200 mL/hr over 30 Minutes Intravenous Daily at bedtime 04/27/19 0052     04/26/19 2000  remdesivir 200 mg in sodium chloride 0.9% 250 mL IVPB     200 mg 580 mL/hr over 30 Minutes Intravenous Once 04/26/19 1919 04/26/19 2111        Code Status: Full Code   Diet Order            DIET - DYS 1 Room service appropriate? Yes; Fluid consistency: Thin  Diet effective now              Infusions:  . azithromycin Stopped (04/27/19 0404)  . cefTRIAXone (ROCEPHIN)  IV Stopped (04/27/19 0218)  . dextrose 5 % and 0.45% NaCl 50 mL/hr at 04/27/19 0953  . remdesivir 100 mg in NS 100 mL 100 mg (04/27/19 0912)    Scheduled Meds: . aspirin  325 mg Oral Daily  . dexamethasone (DECADRON) injection  6 mg Intravenous Q24H  . enoxaparin (LOVENOX) injection  40 mg Subcutaneous Q12H  . feeding supplement (ENSURE ENLIVE)  237 mL Oral BID BM  . feeding supplement (PRO-STAT SUGAR FREE 64)  30 mL Oral BID  . ferrous sulfate  325 mg Oral QAC breakfast  . Melatonin  3 mg Oral QHS  . pantoprazole  40 mg Oral Daily  . potassium chloride  10 mEq Oral Daily  . sertraline  50 mg Oral Daily  . traZODone  50 mg Oral QHS    PRN meds: acetaminophen, hydrOXYzine   Objective: Vitals:   04/27/19 0920 04/27/19 1604  BP: 126/88 125/71  Pulse: 90 85  Resp: 20 20  Temp: 98.6 F (37 C) 97.9 F (36.6 C)  SpO2: 92% 100%    Intake/Output Summary (Last 24 hours) at 04/27/2019 1651 Last data filed at 04/27/2019 1600 Gross per 24 hour  Intake 1235 ml  Output 600 ml  Net 635 ml   Filed Weights   04/26/19 2241  Weight: 66.8 kg   Weight change:  Body mass index is 25.28 kg/m.   Physical Exam: General exam: Appears calm and comfortable.  Skin: No rashes, lesions or ulcers. HEENT: Atraumatic, normocephalic, supple neck, no obvious bleeding Lungs: Clear to auscultation bilaterally CVS: Regular rate  and rhythm, no murmur GI/Abd soft, nontender, nondistended, bowel sound present CNS: Alert, awake, cheerful, demented, able to follow motor commands Psychiatry: Mood appropriate Extremities: No pedal edema, no calf tenderness  Data Review: I have personally reviewed the laboratory data and studies available.  Recent Labs  Lab 04/26/19 1657 04/27/19 0328  WBC 7.8 5.8  NEUTROABS 6.1 PENDING  HGB 10.8* 10.3*  HCT 34.9* 33.8*  MCV 102.9* 105.6*  PLT 394 375   Recent Labs  Lab 04/26/19 1657 04/27/19 0328  NA 145 149*  K 2.9* 3.9  CL 108 115*  CO2 24 23  GLUCOSE 108* 120*  BUN 18 18  CREATININE 0.76 0.69  CALCIUM 8.6* 8.3*    Lorin Glass, MD  [facility] 04/27/2019

## 2019-04-27 NOTE — Progress Notes (Signed)
Bladder scan done at 1315 showing 192 ml in bladder. Pt does not appear uncomfortable and bladder does not arrear to be distended. I&O cath done at 0550 per previous shift. Pt did have a large incontinent BM at 1100 and could have voided a small amount then also. MD Dahal made aware of findings- will continue to monitor UOP closely.   Also, pt is a total care patient and requires extensive coaching and patience with feedings. Pt packs food frequently and takes a very long to swallow. Pt also forgets how to swallow and use a straw- RN used the bottom end of the straw to place a few drops of liquid in her mouth at a time. Pt did swallow her pills whole in AS. MD Dahal made aware- diet changed to puree (Dys 1) for now and ST Eval ordered.

## 2019-04-27 NOTE — Progress Notes (Signed)
Unable to answer all admission questions. Pt has Dementia and only oriented to self.

## 2019-04-27 NOTE — Progress Notes (Signed)
Second bladder scan done at 1600 showing only 89 ml. Pt had voided close to 200 ml in purewick cannister- some leaking incontinence also. Will continue to monitor.

## 2019-04-27 NOTE — Progress Notes (Signed)
  Echocardiogram 2D Echocardiogram has been performed.  Christina Buck 04/27/2019, 8:46 AM

## 2019-04-28 LAB — COMPREHENSIVE METABOLIC PANEL
ALT: 41 U/L (ref 0–44)
AST: 48 U/L — ABNORMAL HIGH (ref 15–41)
Albumin: 2.5 g/dL — ABNORMAL LOW (ref 3.5–5.0)
Alkaline Phosphatase: 65 U/L (ref 38–126)
Anion gap: 13 (ref 5–15)
BUN: 22 mg/dL (ref 8–23)
CO2: 23 mmol/L (ref 22–32)
Calcium: 8.2 mg/dL — ABNORMAL LOW (ref 8.9–10.3)
Chloride: 116 mmol/L — ABNORMAL HIGH (ref 98–111)
Creatinine, Ser: 0.73 mg/dL (ref 0.44–1.00)
GFR calc Af Amer: >60 mL/min (ref >60)
GFR calc non Af Amer: >60 mL/min (ref >60)
Glucose, Bld: 137 mg/dL — ABNORMAL HIGH (ref 70–99)
Potassium: 3.8 mmol/L (ref 3.5–5.1)
Sodium: 152 mmol/L — ABNORMAL HIGH (ref 135–145)
Total Bilirubin: 0.7 mg/dL (ref 0.3–1.2)
Total Protein: 6.1 g/dL — ABNORMAL LOW (ref 6.5–8.1)

## 2019-04-28 LAB — CBC WITH DIFFERENTIAL/PLATELET
Abs Immature Granulocytes: 0.15 10*3/uL — ABNORMAL HIGH (ref 0.00–0.07)
Basophils Absolute: 0 10*3/uL (ref 0.0–0.1)
Basophils Relative: 0 %
Eosinophils Absolute: 0 10*3/uL (ref 0.0–0.5)
Eosinophils Relative: 0 %
HCT: 32.6 % — ABNORMAL LOW (ref 36.0–46.0)
Hemoglobin: 9.9 g/dL — ABNORMAL LOW (ref 12.0–15.0)
Immature Granulocytes: 2 %
Lymphocytes Relative: 13 %
Lymphs Abs: 0.9 10*3/uL (ref 0.7–4.0)
MCH: 32.1 pg (ref 26.0–34.0)
MCHC: 30.4 g/dL (ref 30.0–36.0)
MCV: 105.8 fL — ABNORMAL HIGH (ref 80.0–100.0)
Monocytes Absolute: 0.3 10*3/uL (ref 0.1–1.0)
Monocytes Relative: 5 %
Neutro Abs: 5.3 10*3/uL (ref 1.7–7.7)
Neutrophils Relative %: 80 %
Platelets: 398 10*3/uL (ref 150–400)
RBC: 3.08 MIL/uL — ABNORMAL LOW (ref 3.87–5.11)
RDW: 13.4 % (ref 11.5–15.5)
WBC: 6.7 10*3/uL (ref 4.0–10.5)
nRBC: 0.6 % — ABNORMAL HIGH (ref 0.0–0.2)

## 2019-04-28 MED ORDER — LIP MEDEX EX OINT
TOPICAL_OINTMENT | CUTANEOUS | Status: AC
  Filled 2019-04-28: qty 7

## 2019-04-28 MED ORDER — DEXTROSE 5 % IV SOLN: INTRAVENOUS | Status: DC

## 2019-04-28 NOTE — Progress Notes (Signed)
Spoke with patient son, phone number updated in chart

## 2019-04-28 NOTE — Evaluation (Signed)
Clinical/Bedside Swallow Evaluation Patient Details  Name: Christina Buck MRN: [id number] Date of Birth: 19-Sep-1950  Today's Date: 04/28/2019 Time: SLP Start Time (ACUTE ONLY): 1725 SLP Stop Time (ACUTE ONLY): 1745 SLP Time Calculation (min) (ACUTE ONLY): 20 min  Past Medical History:  Past Medical History:  Diagnosis Date  . Anxiety   . Arthritis   . Dementia [facility])    Past Surgical History: History reviewed. No pertinent surgical history. HPI:      Assessment / Plan / Recommendation Clinical Impression  Patient presents with what appears to be a primarily cognitive-based dysphagia in light of premorbid diagnosis of advanced dementia. She exhibited fluctuating attention to and acceptance of boluses, and delays in oral transit and swallow initiation, but no overt s/s of aspiration or penetration. She was able to hold cup and give self sips. Patient is at high risk for malnutrition and dehydration and unfortunately, prognosis for her swallow function to improve is poor and she appears to be in the end stages of dementia. SLP Visit Diagnosis: Dysphagia, unspecified (R13.10)    Aspiration Risk  Risk for inadequate nutrition/hydration;Mild aspiration risk;Moderate aspiration risk    Diet Recommendation Dysphagia 1 (Puree);Thin liquid   Liquid Administration via: Straw;Cup;Spoon Medication Administration: Crushed with puree Supervision: Full supervision/cueing for compensatory strategies;Staff to assist with self feeding Compensations: Minimize environmental distractions;Slow rate;Small sips/bites Postural Changes: Seated upright at 90 degrees    Other  Recommendations Oral Care Recommendations: Oral care BID;Staff/trained caregiver to provide oral care   Follow up Recommendations Skilled Nursing facility      Frequency and Duration min 1 x/week  1 week       Prognosis Prognosis for Safe Diet Advancement: Guarded Barriers to Reach Goals: Cognitive deficits;Time post onset       Swallow Study   General      Oral/Motor/Sensory Function Overall Oral Motor/Sensory Function: Within functional limits   Ice Chips     Thin Liquid Thin Liquid: Impaired Presentation: Cup;Self Fed Oral Phase Impairments: Poor awareness of bolus Oral Phase Functional Implications: Prolonged oral transit Pharyngeal  Phase Impairments: Suspected delayed Swallow    Nectar Thick     Honey Thick     Puree Puree: Impaired Oral Phase Impairments: Poor awareness of bolus Oral Phase Functional Implications: Prolonged oral transit   Solid     Solid: Not tested      Nadara Mode Tarrell 04/28/2019,7:27 PM   Sonia Baller, MA, CCC-SLP Speech Therapy [facility]

## 2019-04-28 NOTE — Progress Notes (Signed)
PROGRESS NOTE  Christina Buck  DOB: [birth year]  PCP: Dineen Kid, MD GMW:[id number]  DOA: 04/26/2019  LOS: 2 days   Chief Complaint  Patient presents with  . COVID+   Brief narrative: Christina Buck is a 68 y.o. female, with severe dementia apparently who tested covid-19 positive about 2 weeks prior to presentation. Patient presented to the ED on 12/24 with fatigue from [facility]. In the ED, patient was 89% on room air, afebrile.  COVID-19 PCR positive. CTA chest showed extensive patchy ground-glass attenuation in both lungs, consistent with the reported clinical history of viral pneumonia. Blood work showed WBC normal at 7.8, lactic acid normal at 1.1, procalcitonin 0.16 Pt was admitted for acute respiratory failure with hypoxia, secondary to covid-19  Subjective: Patient was seen and examined this morning.  Pleasant elderly African-American female.  Seems to have baseline dementia.  She was cheerful but not able to answer any questions to me.  Not in physical distress.  Assessment/Plan: COVID pneumonia Acute respiratory failure with hypoxia -Presented with fatigue, hypoxia -CTA chest showed extensive patchy groundglass attenuation in both lungs -Treatment: 10-day course of IV dexamethasone 6 mg and 5-day course of IV remdesivir today none 12/28  -Supportive care: Vitamin C, Zinc, inhalers, Tylenol, Antitussives -benzonatate, Mucinex -Oxygen -not on supplemental oxygen at home.  89% room air on arrival.  Currently on 2 L supplemental oxygen. -Labs and biomarker trend as below.  Lab Results  Component Value Date   SARSCOV2NAA POSITIVE (A) 04/26/2019    Recent Labs  Lab 04/26/19 1657 04/27/19 0328 04/28/19 0311  WBC 7.8 5.8 6.7   Recent Labs    04/26/19 1633 04/26/19 1657  DDIMER  --  5.87*  FERRITIN 459*  --   LDH  --  418*  CRP 13.2*  --    Community-acquired pneumonia -Initial procalcitonin level on admission was slightly elevated at 0.16. -With suspicion  of secondary bacterial infection, patient was started on Rocephin 1gm iv qday, Zithromax 500mg  iv qday which we will continue.  Acute hypernatremia -sodium level continues to rise, 152 today. IV fluids changed to dextrose only solution. Continue to monitor labs.   Acute metabolic encephalopathy Dementia, severe/  Anxiety -Patient is cheerful but not able to answer any question appropriately.  She seems very confused.  She has a history of dementia.  Additionally, acutely she has Covid pneumonia as well as hyponatremia.  Expect some improvement in mental status back to baseline.  For now continue Zoloft and trazodone.   Abnromal liver function Negative acute hepatitis panel Normal RUQ ultrasound Repeat cmp in am  Elevated troponin  -Only slightly elevated troponin.  Likely 1 ischemia.  Echo with EF 60 to 65%, no WMA  R mediastinal / hilar adenopathy Please repeat CT chest in 3 months per radiology recommendations  Severe protein calorie malnutrition Start Pro-stat 97mL po bid  Chronic anemia Cont ferrous sulfate Repeat cbc in am  GERD Cont PPI  ? Peripheral edema Keep Lasix on hold.  Mobility: Encourage ambulation.  PT eval ordered. Diet: Dysphagia 1 diet.  Speech therapy eval Fluid: D5 half NS at 50 mL/h DVT prophylaxis:  Lovenox subcu Code Status:  Full code Family Communication:  Not at bedside Expected Discharge:  Remdesivir to continue till 12/28.  Consultants:  None  Procedures:  None  Antimicrobials: Anti-infectives (From admission, onward)   Start     Dose/Rate Route Frequency Ordered Stop   04/27/19 1000  remdesivir 100 mg in sodium chloride 0.9 % 100 mL  IVPB     100 mg 200 mL/hr over 30 Minutes Intravenous Daily 04/26/19 1919 05/01/19 0959   04/27/19 0200  azithromycin (ZITHROMAX) 500 mg in sodium chloride 0.9 % 250 mL IVPB     500 mg 250 mL/hr over 60 Minutes Intravenous Daily 04/27/19 0052     04/27/19 0100  cefTRIAXone (ROCEPHIN) 1 g in  sodium chloride 0.9 % 100 mL IVPB     1 g 200 mL/hr over 30 Minutes Intravenous Daily at bedtime 04/27/19 0052     04/26/19 2000  remdesivir 200 mg in sodium chloride 0.9% 250 mL IVPB     200 mg 580 mL/hr over 30 Minutes Intravenous Once 04/26/19 1919 04/26/19 2111        Code Status: Full Code   Diet Order            DIET - DYS 1 Room service appropriate? Yes; Fluid consistency: Thin  Diet effective now              Infusions:  . azithromycin 500 mg (04/28/19 0609)  . cefTRIAXone (ROCEPHIN)  IV 1 g (04/27/19 2326)  . dextrose 50 mL/hr at 04/28/19 0837  . remdesivir 100 mg in NS 100 mL Stopped (04/28/19 1254)    Scheduled Meds: . aspirin  325 mg Oral Daily  . dexamethasone (DECADRON) injection  6 mg Intravenous Q24H  . enoxaparin (LOVENOX) injection  40 mg Subcutaneous Q12H  . feeding supplement (ENSURE ENLIVE)  237 mL Oral BID BM  . feeding supplement (PRO-STAT SUGAR FREE 64)  30 mL Oral BID  . ferrous sulfate  325 mg Oral QAC breakfast  . Melatonin  3 mg Oral QHS  . pantoprazole  40 mg Oral Daily  . potassium chloride  10 mEq Oral Daily  . sertraline  50 mg Oral Daily  . traZODone  50 mg Oral QHS    PRN meds: acetaminophen, hydrOXYzine   Objective: Vitals:   04/28/19 0513 04/28/19 1428  BP: 131/71 (!) 138/97  Pulse: 80 98  Resp: 18 18  Temp: 98.4 F (36.9 C) 99.2 F (37.3 C)  SpO2: 97%     Intake/Output Summary (Last 24 hours) at 04/28/2019 1509 Last data filed at 04/28/2019 1300 Gross per 24 hour  Intake 800 ml  Output 750 ml  Net 50 ml   Filed Weights   04/26/19 2241  Weight: 66.8 kg   Weight change:  Body mass index is 25.28 kg/m.   Physical Exam: General exam: Appears calm and comfortable. Dementia at baseline. Unable to follow commands. Skin: No rashes, lesions or ulcers. HEENT: Atraumatic, normocephalic, supple neck, no obvious bleeding Lungs: Clear to auscultation bilaterally CVS: Regular rate and rhythm, no murmur GI/Abd soft,  nontender, nondistended, bowel sound present CNS: Alert, awake, cheerful, demented, unable to follow command Psychiatry: Mood appropriate Extremities: No pedal edema, no calf tenderness  Data Review: I have personally reviewed the laboratory data and studies available.  Recent Labs  Lab 04/26/19 1657 04/27/19 0328 04/28/19 0311  WBC 7.8 5.8 6.7  NEUTROABS 6.1 4.7 5.3  HGB 10.8* 10.3* 9.9*  HCT 34.9* 33.8* 32.6*  MCV 102.9* 105.6* 105.8*  PLT 394 375 398   Recent Labs  Lab 04/26/19 1657 04/27/19 0328 04/28/19 0311  NA 145 149* 152*  K 2.9* 3.9 3.8  CL 108 115* 116*  CO2 24 23 23   GLUCOSE 108* 120* 137*  BUN 18 18 22   CREATININE 0.76 0.69 0.73  CALCIUM 8.6* 8.3* 8.2*  Lorin Glass, MD  [facility] 04/28/2019

## 2019-04-28 NOTE — Evaluation (Signed)
Physical Therapy Evaluation Patient Details Name: Christina Buck MRN: [id number] DOB: [birth year] Today's Date: 04/28/2019   History of Present Illness  67 yo female admitted to ED on 12/24 with acute respiratory failure due to COVID-19 PNA. PMH includes dementia.  Clinical Impression  Pt presents with generalized weakness, difficulty following mobility commands which may be pt baseline, impaired sitting balance, and difficulty performing mobility tasks. Pt to benefit from acute PT to address deficits. Pt sat EOB only this session, limited by restlessness verging on agitation. PT recommending SNF level of care post-acutely. PT to progress mobility as tolerated, and will continue to follow acutely.    Of note, pt satting at 85% on RA upon PT arrival, required x3 attempts to place 2LO2 on pt via [place] because it irritated pt.    Follow Up Recommendations SNF;Supervision/Assistance - 24 hour    Equipment Recommendations  None recommended by PT    Recommendations for Other Services       Precautions / Restrictions Precautions Precautions: Fall Precaution Comments: sats Restrictions Weight Bearing Restrictions: No      Mobility  Bed Mobility Overal bed mobility: Needs Assistance Bed Mobility: Supine to Sit;Sit to Supine     Supine to sit: Max assist;HOB elevated Sit to supine: Min guard;HOB elevated   General bed mobility comments: max assist for supine to sit for trunk elevation, LE management, and scooting to EOB. AFter ~1 minute sitting EOB, pt lifted bilateral LEs into bed and laid down with min guard for safety.  Transfers Overall transfer level: (NT)                  Ambulation/Gait                Stairs            Wheelchair Mobility    Modified Rankin (Stroke Patients Only)       Balance Overall balance assessment: Needs assistance Sitting-balance support: Bilateral upper extremity supported;Feet supported Sitting balance-Leahy Scale: Fair          Standing balance comment: unable                             Pertinent Vitals/Pain Pain Assessment: Faces Faces Pain Scale: No hurt Pain Intervention(s): Monitored during session    Home Living Family/patient expects to be discharged to:: Skilled nursing facility                      Prior Function           Comments: unsure, pt unable to state to PT when asked and chart review unrevealing of PLOF.     Hand Dominance   Dominant Hand: Right    Extremity/Trunk Assessment   Upper Extremity Assessment Upper Extremity Assessment: Difficult to assess due to impaired cognition;Generalized weakness    Lower Extremity Assessment Lower Extremity Assessment: Difficult to assess due to impaired cognition;Generalized weakness    Cervical / Trunk Assessment Cervical / Trunk Assessment: Normal  Communication   Communication: No difficulties  Cognition Arousal/Alertness: Awake/alert Behavior During Therapy: Restless Overall Cognitive Status: History of cognitive impairments - at baseline Area of Impairment: Orientation;Memory;Following commands;Safety/judgement;Problem solving;Awareness                 Orientation Level: Disoriented to;Place;Situation;Time   Memory: Decreased short-term memory Following Commands: Follows one step commands inconsistently Safety/Judgement: Decreased awareness of deficits;Decreased awareness of safety Awareness: Intellectual Problem Solving: Decreased  initiation;Difficulty sequencing;Requires verbal cues;Requires tactile cues;Slow processing General Comments: Pt with baseline of dementia, follows 20% of verbal cuing, often requires tactile cuing to participate. Pt able to state name when asked, but when asked what her birthday was pt also states her name.      General Comments      Exercises     Assessment/Plan    PT Assessment Patient needs continued PT services  PT Problem List Decreased  strength;Decreased mobility;Decreased activity tolerance;Decreased knowledge of use of DME;Decreased balance;Decreased cognition;Cardiopulmonary status limiting activity;Decreased safety awareness       PT Treatment Interventions DME instruction;Therapeutic activities;Gait training;Therapeutic exercise;Patient/family education;Balance training;Stair training;Functional mobility training    PT Goals (Current goals can be found in the Care Plan section)  Acute Rehab PT Goals PT Goal Formulation: Patient unable to participate in goal setting Time For Goal Achievement: 05/12/19 Potential to Achieve Goals: Fair    Frequency Min 2X/week   Barriers to discharge        Co-evaluation               AM-PAC PT "6 Clicks" Mobility  Outcome Measure Help needed turning from your back to your side while in a flat bed without using bedrails?: Total Help needed moving from lying on your back to sitting on the side of a flat bed without using bedrails?: Total Help needed moving to and from a bed to a chair (including a wheelchair)?: Total Help needed standing up from a chair using your arms (e.g., wheelchair or bedside chair)?: Total Help needed to walk in hospital room?: Total Help needed climbing 3-5 steps with a railing? : Total 6 Click Score: 6    End of Session Equipment Utilized During Treatment: Oxygen Activity Tolerance: Patient limited by fatigue;Treatment limited secondary to agitation(restlessness) Patient left: in bed;with call bell/phone within reach;with bed alarm set;with restraints reapplied;with SCD's reapplied(mitts) Nurse Communication: Mobility status PT Visit Diagnosis: Other abnormalities of gait and mobility (R26.89);Difficulty in walking, not elsewhere classified (R26.2);Muscle weakness (generalized) (M62.81)    Time: 1610-9604 PT Time Calculation (min) (ACUTE ONLY): 19 min   Charges:   PT Evaluation $PT Eval Low Complexity: 1 Low          Jerri Glauser E,  PT Acute Rehabilitation Services Pager [phone]  Office [phone]   Trestin Vences D Despina Hidden 04/28/2019, 5:57 PM

## 2019-04-28 NOTE — Progress Notes (Signed)
Patient refusing to wear nasal cannula, take po meds, and did not allow RN to take blood pressure or temperature. After much difficulty RN was able to put heart monitor leads back on patient and safety mitt. Pt lying still with no agitation when someone is not bothering her. Pt is slightly tachypneic but 02 sat staying around 90-91 on RA. Notified on-call hospitalist and will continue to monitor and attempt to carry out plan of care.

## 2019-04-28 NOTE — Progress Notes (Signed)
Patient has refused to take PO meds after several attempts will continue to try again at a later time.

## 2019-04-29 LAB — COMPREHENSIVE METABOLIC PANEL
ALT: 40 U/L (ref 0–44)
AST: 43 U/L — ABNORMAL HIGH (ref 15–41)
Albumin: 2.6 g/dL — ABNORMAL LOW (ref 3.5–5.0)
Alkaline Phosphatase: 63 U/L (ref 38–126)
Anion gap: 10 (ref 5–15)
BUN: 18 mg/dL (ref 8–23)
CO2: 21 mmol/L — ABNORMAL LOW (ref 22–32)
Calcium: 8.2 mg/dL — ABNORMAL LOW (ref 8.9–10.3)
Chloride: 116 mmol/L — ABNORMAL HIGH (ref 98–111)
Creatinine, Ser: 0.77 mg/dL (ref 0.44–1.00)
GFR calc Af Amer: >60 mL/min (ref >60)
GFR calc non Af Amer: >60 mL/min (ref >60)
Glucose, Bld: 125 mg/dL — ABNORMAL HIGH (ref 70–99)
Potassium: 3.8 mmol/L (ref 3.5–5.1)
Sodium: 147 mmol/L — ABNORMAL HIGH (ref 135–145)
Total Bilirubin: 0.7 mg/dL (ref 0.3–1.2)
Total Protein: 6.2 g/dL — ABNORMAL LOW (ref 6.5–8.1)

## 2019-04-29 LAB — CBC WITH DIFFERENTIAL/PLATELET
Abs Immature Granulocytes: 0.16 10*3/uL — ABNORMAL HIGH (ref 0.00–0.07)
Basophils Absolute: 0 10*3/uL (ref 0.0–0.1)
Basophils Relative: 0 %
Eosinophils Absolute: 0 10*3/uL (ref 0.0–0.5)
Eosinophils Relative: 0 %
HCT: 35.9 % — ABNORMAL LOW (ref 36.0–46.0)
Hemoglobin: 10.7 g/dL — ABNORMAL LOW (ref 12.0–15.0)
Immature Granulocytes: 2 %
Lymphocytes Relative: 16 %
Lymphs Abs: 1.2 10*3/uL (ref 0.7–4.0)
MCH: 31.9 pg (ref 26.0–34.0)
MCHC: 29.8 g/dL — ABNORMAL LOW (ref 30.0–36.0)
MCV: 107.2 fL — ABNORMAL HIGH (ref 80.0–100.0)
Monocytes Absolute: 0.4 10*3/uL (ref 0.1–1.0)
Monocytes Relative: 5 %
Neutro Abs: 5.6 10*3/uL (ref 1.7–7.7)
Neutrophils Relative %: 77 %
Platelets: 439 10*3/uL — ABNORMAL HIGH (ref 150–400)
RBC: 3.35 MIL/uL — ABNORMAL LOW (ref 3.87–5.11)
RDW: 13.4 % (ref 11.5–15.5)
WBC: 7.3 10*3/uL (ref 4.0–10.5)
nRBC: 0.5 % — ABNORMAL HIGH (ref 0.0–0.2)

## 2019-04-29 LAB — LEGIONELLA PNEUMOPHILA SEROGP 1 UR AG: L. pneumophila Serogp 1 Ur Ag: NEGATIVE

## 2019-04-29 MED ORDER — SODIUM CHLORIDE 0.9 % IV SOLN: INTRAVENOUS | Status: DC | PRN

## 2019-04-29 NOTE — Progress Notes (Signed)
Patient absolutely refuses to take the Pro-STAT sugar free when offered on several different attempts.

## 2019-04-29 NOTE — Progress Notes (Signed)
Initial Nutrition Assessment  INTERVENTION:   -Ensure Enlive po BID, each supplement provides 350 kcal and 20 grams of protein -Prostat liquid protein PO 30 ml BID with meals, each supplement provides 100 kcal, 15 grams protein.  NUTRITION DIAGNOSIS:   Increased nutrient needs related to acute illness(COVID-19 infection) as evidenced by estimated needs.  GOAL:   Patient will meet greater than or equal to 90% of their needs  MONITOR:   PO intake, Supplement acceptance, Labs, Weight trends, I & O's  REASON FOR ASSESSMENT:   Malnutrition Screening Tool    ASSESSMENT:   68 y.o. female, with severe dementia apparently who tested covid-19 positive about 2 weeks prior to presentation. Patient presented to the ED on 12/24 with fatigue from [facility]. Pt was admitted for acute respiratory failure with hypoxia, secondary to covid-19  **RD working remotely**  Patient with advanced dementia, alert/oriented x 1. Patient continues to be COVID-19 positive.  Per SLP note 12/26, recommend dysphagia 1 diet given cognitive-based dysphagia. Mild-moderate risk of aspiration.  Currently not eating much of meals, mainly bites of grits. Pt is drinking Ensure supplements ordered and accepting Prostat today.  Per weight records in care everywhere, pt weighed 166 lbs on 6/12. Pt has lost 19 lbs since 6/12 ( 11% wt loss x 6.5 months, significant for time frame).   I/Os: +1.1L since admit UOP: 200 ml x 24 hrs  Medications: Ferrous sulfate tablet, KLOR-CON tablet Labs reviewed: Elevated Na  NUTRITION - FOCUSED PHYSICAL EXAM:  Working remotely.  Diet Order:   Diet Order            DIET - DYS 1 Room service appropriate? Yes; Fluid consistency: Thin  Diet effective now              EDUCATION NEEDS:   No education needs have been identified at this time  Skin:  Skin Assessment: Reviewed RN Assessment  Last BM:  12/27 -type 4  Height:   Ht Readings from Last 1 Encounters:   04/26/19 5\' 4"  (1.626 m)    Weight:   Wt Readings from Last 1 Encounters:  04/26/19 66.8 kg    Ideal Body Weight:  54.5 kg  BMI:  Body mass index is 25.28 kg/m.  Estimated Nutritional Needs:   Kcal:  1650-1850  Protein:  75-85g  Fluid:  1.8L/day  Christina Bibles, MS, RD, LDN Inpatient Clinical Dietitian Pager: [phone] After Hours Pager: [phone]

## 2019-04-29 NOTE — Progress Notes (Signed)
PROGRESS NOTE  [facility]  DOB: 1/6/10963/08/1950  PCP: Selina CooleyWitten, Bobby, MD EAV:[id number]  DOA: 04/26/2019  LOS: 3 days   Chief Complaint  Patient presents with  . COVID+   Brief narrative: Christina Buck is a 68 y.o. female, with severe dementia apparently who tested covid-19 positive about 2 weeks prior to presentation. Patient presented to the ED on [facility]. In the ED, patient was 89% on room air, afebrile.  COVID-19 PCR positive. CTA chest showed extensive patchy ground-glass attenuation in both lungs, consistent with the reported clinical history of viral pneumonia. Blood work showed WBC normal at 7.8, lactic acid normal at 1.1, procalcitonin 0.16 Pt was admitted for acute respiratory failure with hypoxia, secondary to covid-19  Subjective: Patient was seen and examined this morning.  Pleasant elderly African-American female.  Seems to have baseline dementia.  She was cheerful but not able to answer any questions to me.  Not in physical distress.  Assessment/Plan: COVID pneumonia Acute respiratory failure with hypoxia -Presented with fatigue, hypoxia -CTA chest showed extensive patchy groundglass attenuation in both lungs -Treatment: 10-day course of IV dexamethasone 6 mg and 5-day course of IV remdesivir today none 12/28  -Supportive care: Vitamin C, Zinc, inhalers, Tylenol, Antitussives -benzonatate, Mucinex -Oxygen -not on supplemental oxygen at home.  89% room air on arrival.  Currently on 2 L supplemental oxygen. -Labs and biomarker trend as below.  Lab Results  Component Value Date   SARSCOV2NAA POSITIVE (A) 04/26/2019    Recent Labs  Lab 04/26/19 1657 04/27/19 0328 04/28/19 0311 04/29/19 0328  WBC 7.8 5.8 6.7 7.3   Community-acquired pneumonia -Initial procalcitonin level on admission was slightly elevated at 0.16. -With suspicion of secondary bacterial infection, patient was started on Rocephin 1gm iv qday, Zithromax 500mg  iv qday  which we will continue.  Acute hypernatremia -sodium level peaked at 152.  We will change IV fluid, sodium level is improved 147 today.  Continue to monitor labs.  Acute metabolic encephalopathy Dementia, severe/  Anxiety -Patient is cheerful but not able to answer any question appropriately.  She seems very confused.  She has a history of dementia.  Additionally, acutely she has Covid pneumonia as well as hyponatremia.  Expect some improvement in mental status back to baseline.  For now continue Zoloft and trazodone.   Abnromal liver function Negative acute hepatitis panel Normal RUQ ultrasound Repeat cmp in am  Elevated troponin  -Only slightly elevated troponin.  Likely 1 ischemia.  Echo with EF 60 to 65%, no WMA  R mediastinal / hilar adenopathy Please repeat CT chest in 3 months per radiology recommendations  Severe protein calorie malnutrition Start Pro-stat 30mL po bid  Chronic anemia Cont ferrous sulfate Repeat cbc in am  GERD Cont PPI  ? Peripheral edema Keep Lasix on hold.  Mobility: Encourage ambulation.  PT eval ordered. Diet: Dysphagia 1 diet.  Speech therapy eval Fluid: D5 half NS at 50 mL/h DVT prophylaxis:  Lovenox subcu Code Status:  Full code Family Communication:  Not at bedside.  Updated patient's son this morning. Expected Discharge:  Remdesivir to continue till 12/28.  Hoping to discharge back to SNF tomorrow.  Consultants:  None  Procedures:  None  Antimicrobials: Anti-infectives (From admission, onward)   Start     Dose/Rate Route Frequency Ordered Stop   04/27/19 1000  remdesivir 100 mg in sodium chloride 0.9 % 100 mL IVPB     100 mg 200 mL/hr over 30 Minutes Intravenous Daily 04/26/19  1919 05/01/19 0959   04/27/19 0200  azithromycin (ZITHROMAX) 500 mg in sodium chloride 0.9 % 250 mL IVPB     500 mg 250 mL/hr over 60 Minutes Intravenous Daily 04/27/19 0052     04/27/19 0100  cefTRIAXone (ROCEPHIN) 1 g in sodium chloride 0.9 %  100 mL IVPB     1 g 200 mL/hr over 30 Minutes Intravenous Daily at bedtime 04/27/19 0052     04/26/19 2000  remdesivir 200 mg in sodium chloride 0.9% 250 mL IVPB     200 mg 580 mL/hr over 30 Minutes Intravenous Once 04/26/19 1919 04/26/19 2111        Code Status: Full Code   Diet Order            DIET - DYS 1 Room service appropriate? Yes; Fluid consistency: Thin  Diet effective now              Infusions:  . azithromycin 500 mg (04/29/19 0454)  . cefTRIAXone (ROCEPHIN)  IV 1 g (04/28/19 2044)  . dextrose Stopped (04/29/19 1300)  . remdesivir 100 mg in NS 100 mL 100 mg (04/29/19 1304)    Scheduled Meds: . aspirin  325 mg Oral Daily  . dexamethasone (DECADRON) injection  6 mg Intravenous Q24H  . enoxaparin (LOVENOX) injection  40 mg Subcutaneous Q12H  . feeding supplement (ENSURE ENLIVE)  237 mL Oral BID BM  . feeding supplement (PRO-STAT SUGAR FREE 64)  30 mL Oral BID  . ferrous sulfate  325 mg Oral QAC breakfast  . Melatonin  3 mg Oral QHS  . pantoprazole  40 mg Oral Daily  . potassium chloride  10 mEq Oral Daily  . sertraline  50 mg Oral Daily  . traZODone  50 mg Oral QHS    PRN meds: acetaminophen, hydrOXYzine   Objective: Vitals:   04/29/19 0500 04/29/19 1403  BP:  132/80  Pulse:  92  Resp: (!) 25   Temp:  98.8 F (37.1 C)  SpO2:      Intake/Output Summary (Last 24 hours) at 04/29/2019 1705 Last data filed at 04/29/2019 1430 Gross per 24 hour  Intake 1166.67 ml  Output 750 ml  Net 416.67 ml   Filed Weights   04/26/19 2241  Weight: 66.8 kg   Weight change:  Body mass index is 25.28 kg/m.   Physical Exam: General exam: Appears calm and comfortable. Dementia at baseline. Unable to follow commands.  On low-flow oxygen. Skin: No rashes, lesions or ulcers. HEENT: Atraumatic, normocephalic, supple neck, no obvious bleeding Lungs: Clear to auscultation bilaterally CVS: Regular rate and rhythm, no murmur GI/Abd soft, nontender, nondistended,  bowel sound present CNS: Alert, awake, cheerful, demented, unable to follow command Psychiatry: Mood appropriate.  Cheerfully demented Extremities: No pedal edema, no calf tenderness  Data Review: I have personally reviewed the laboratory data and studies available.  Recent Labs  Lab 04/26/19 1657 04/27/19 0328 04/28/19 0311 04/29/19 0328  WBC 7.8 5.8 6.7 7.3  NEUTROABS 6.1 4.7 5.3 5.6  HGB 10.8* 10.3* 9.9* 10.7*  HCT 34.9* 33.8* 32.6* 35.9*  MCV 102.9* 105.6* 105.8* 107.2*  PLT 394 375 398 439*   Recent Labs  Lab 04/26/19 1657 04/27/19 0328 04/28/19 0311 04/29/19 0328  NA 145 149* 152* 147*  K 2.9* 3.9 3.8 3.8  CL 108 115* 116* 116*  CO2 24 23 23  21*  GLUCOSE 108* 120* 137* 125*  BUN 18 18 22 18   CREATININE 0.76 0.69 0.73 0.77  CALCIUM 8.6*  8.3* 8.2* 8.2*    Terrilee Croak, MD  [facility] 04/29/2019

## 2019-04-30 LAB — CBC WITH DIFFERENTIAL/PLATELET
Abs Immature Granulocytes: 0.2 10*3/uL — ABNORMAL HIGH (ref 0.00–0.07)
Basophils Absolute: 0 10*3/uL (ref 0.0–0.1)
Basophils Relative: 1 %
Eosinophils Absolute: 0 10*3/uL (ref 0.0–0.5)
Eosinophils Relative: 0 %
HCT: 33.6 % — ABNORMAL LOW (ref 36.0–46.0)
Hemoglobin: 10.1 g/dL — ABNORMAL LOW (ref 12.0–15.0)
Immature Granulocytes: 3 %
Lymphocytes Relative: 16 %
Lymphs Abs: 1.1 10*3/uL (ref 0.7–4.0)
MCH: 32.1 pg (ref 26.0–34.0)
MCHC: 30.1 g/dL (ref 30.0–36.0)
MCV: 106.7 fL — ABNORMAL HIGH (ref 80.0–100.0)
Monocytes Absolute: 0.3 10*3/uL (ref 0.1–1.0)
Monocytes Relative: 5 %
Neutro Abs: 4.9 10*3/uL (ref 1.7–7.7)
Neutrophils Relative %: 75 %
Platelets: 448 10*3/uL — ABNORMAL HIGH (ref 150–400)
RBC: 3.15 MIL/uL — ABNORMAL LOW (ref 3.87–5.11)
RDW: 13.7 % (ref 11.5–15.5)
WBC: 6.5 10*3/uL (ref 4.0–10.5)
nRBC: 0.8 % — ABNORMAL HIGH (ref 0.0–0.2)

## 2019-04-30 LAB — COMPREHENSIVE METABOLIC PANEL
ALT: 64 U/L — ABNORMAL HIGH (ref 0–44)
AST: 100 U/L — ABNORMAL HIGH (ref 15–41)
Albumin: 2.5 g/dL — ABNORMAL LOW (ref 3.5–5.0)
Alkaline Phosphatase: 61 U/L (ref 38–126)
Anion gap: 6 (ref 5–15)
BUN: 18 mg/dL (ref 8–23)
CO2: 25 mmol/L (ref 22–32)
Calcium: 7.9 mg/dL — ABNORMAL LOW (ref 8.9–10.3)
Chloride: 116 mmol/L — ABNORMAL HIGH (ref 98–111)
Creatinine, Ser: 0.65 mg/dL (ref 0.44–1.00)
GFR calc Af Amer: >60 mL/min (ref >60)
GFR calc non Af Amer: >60 mL/min (ref >60)
Glucose, Bld: 139 mg/dL — ABNORMAL HIGH (ref 70–99)
Potassium: 4.2 mmol/L (ref 3.5–5.1)
Sodium: 147 mmol/L — ABNORMAL HIGH (ref 135–145)
Total Bilirubin: 1.4 mg/dL — ABNORMAL HIGH (ref 0.3–1.2)
Total Protein: 5.6 g/dL — ABNORMAL LOW (ref 6.5–8.1)

## 2019-04-30 MED ORDER — AZITHROMYCIN 250 MG PO TABS
500.0000 mg | ORAL_TABLET | Freq: Every day | ORAL | Status: DC
  Administered 2019-05-01: 500 mg via ORAL
  Filled 2019-04-30: qty 2

## 2019-04-30 NOTE — NC FL2 (Signed)
[place] MEDICAID FL2 LEVEL OF CARE SCREENING TOOL     IDENTIFICATION  Patient Name: Christina Buck Birthdate: [birth year] Sex: female Admission Date (Current Location): 04/26/2019  [facility] and [place] Number:  Herbalist and Address:  [facility],  [place] [address] [place]      Provider Number: [id number]  Attending Physician Name and Address:  Terrilee Croak, MD  Relative Name and Phone Number:       Current Level of Care: Hospital Recommended Level of Care: [place], Memory Care Prior Approval Number:    Date Approved/Denied:   PASRR Number:    Discharge Plan: Other (Comment)(ALF Memory Care)    Current Diagnoses: Patient Active Problem List   Diagnosis Date Noted  . COVID-19 virus infection 04/26/2019  . Acute respiratory failure with hypoxia ([place]) 04/26/2019  . Protein-calorie malnutrition, severe ([place]) 04/26/2019  . Hypokalemia 04/26/2019    Orientation RESPIRATION BLADDER Height & Weight     Self  O2(3L O2 [place]) Incontinent Weight: 66.8 kg Height:  5\' 4"  (162.6 cm)  BEHAVIORAL SYMPTOMS/MOOD NEUROLOGICAL BOWEL NUTRITION STATUS      Incontinent Diet(Dysphagia)  AMBULATORY STATUS COMMUNICATION OF NEEDS Skin   Limited Assist   Normal                       Personal Care Assistance Level of Assistance  Bathing, Feeding, Dressing Bathing Assistance: Limited assistance Feeding assistance: Limited assistance Dressing Assistance: Limited assistance     Functional Limitations Info  Sight, Hearing, Speech Sight Info: Adequate Hearing Info: Adequate Speech Info: Impaired    SPECIAL CARE FACTORS FREQUENCY  PT (By licensed PT), OT (By licensed OT)     PT Frequency: Eval and Treat OT Frequency: Eval and Treat            Contractures Contractures Info: Present    Additional Factors Info  Code Status, Allergies Code Status Info: FULL Allergies Info: No Known Allergies           Current  Medications (04/30/2019):  This is the current hospital active medication list Current Facility-Administered Medications  Medication Dose Route Frequency Provider Last Rate Last Admin  . 0.9 %  sodium chloride infusion   Intravenous PRN Dahal, Marlowe Aschoff, MD      . acetaminophen (TYLENOL) tablet 650 mg  650 mg Oral Q6H PRN Jani Gravel, MD   650 mg at 04/29/19 1410  . aspirin tablet 325 mg  325 mg Oral Daily Jani Gravel, MD   325 mg at 04/30/19 0900  . [START ON 05/01/2019] azithromycin (ZITHROMAX) tablet 500 mg  500 mg Oral Daily Eudelia Bunch, RPH      . cefTRIAXone (ROCEPHIN) 1 g in sodium chloride 0.9 % 100 mL IVPB  1 g Intravenous QHS Jani Gravel, MD 200 mL/hr at 04/29/19 2041 1 g at 04/29/19 2041  . dexamethasone (DECADRON) injection 6 mg  6 mg Intravenous Q24H Jani Gravel, MD   6 mg at 04/29/19 2025  . dextrose 5 % solution   Intravenous Continuous Terrilee Croak, MD   Stopped at 04/30/19 1324  . enoxaparin (LOVENOX) injection 40 mg  40 mg Subcutaneous Q12H Dahal, Binaya, MD   40 mg at 04/30/19 1300  . feeding supplement (ENSURE ENLIVE) (ENSURE ENLIVE) liquid 237 mL  237 mL Oral BID BM Jani Gravel, MD   237 mL at 04/29/19 1652  . feeding supplement (PRO-STAT SUGAR FREE 64) liquid 30 mL  30 mL Oral BID  Pearson Grippe, MD   30 mL at 04/29/19 2034  . ferrous sulfate tablet 325 mg  325 mg Oral QAC breakfast Pearson Grippe, MD   325 mg at 04/30/19 0900  . hydrOXYzine (ATARAX/VISTARIL) tablet 10 mg  10 mg Oral Q8H PRN Pearson Grippe, MD   10 mg at 04/30/19 0900  . Melatonin TABS 3 mg  3 mg Oral QHS Pearson Grippe, MD   3 mg at 04/29/19 2029  . pantoprazole (PROTONIX) EC tablet 40 mg  40 mg Oral Daily Pearson Grippe, MD   40 mg at 04/30/19 0900  . potassium chloride (KLOR-CON) CR tablet 10 mEq  10 mEq Oral Daily Pearson Grippe, MD   10 mEq at 04/30/19 0900  . sertraline (ZOLOFT) tablet 50 mg  50 mg Oral Daily Pearson Grippe, MD   50 mg at 04/30/19 0900  . traZODone (DESYREL) tablet 50 mg  50 mg Oral QHS Pearson Grippe, MD   50 mg at  04/29/19 2029     Discharge Medications: Please see discharge summary for a list of discharge medications.  Relevant Imaging Results:  Relevant Lab Results:   Additional Information SS# [id number]  Geni Bers, RN

## 2019-04-30 NOTE — Progress Notes (Signed)
  Speech Language Pathology Treatment: Dysphagia  Patient Details Name: Christina Buck MRN: [id number] DOB: [birth year] Today's Date: 04/30/2019 Time: 6759-1638 SLP Time Calculation (min) (ACUTE ONLY): 22 min  Assessment / Plan / Recommendation Clinical Impression  SLP provided skilled observation and feeding assistance during lunch meal. Per RN, pt has been restless this afternoon and fidgeting, pulling things off. Mod-Max cues were given to facilitate oral holding today. Although pt is not following commands well, she responded better to liquid washes and SLP replacing oral residue from her buccal cavities more medially for her to swallow. An occasional cough was noted, particularly when her mouth would get very full and she was not swallowing. I think there is risk for aspiration given her mentation, so would provide full supervision and monitoring of oral clearance. SLP did not provide any trials of advanced solids today. Will continue to monitor for now, although suspect that her dysphagia is primarily cognitive in nature and it is unclear what her baseline mentation is.    HPI HPI: 68 y.o. female, with severe dementia apparently who tested covid-19 positive about 2 weeks prior to presentation. Patient presented to the ED on 12/24 with fatigue from [facility]. Pt was admitted for acute respiratory failure with hypoxia, secondary to covid-19      SLP Plan  Continue with current plan of care       Recommendations  Diet recommendations: Dysphagia 1 (puree);Thin liquid Liquids provided via: Cup;Straw Medication Administration: Crushed with puree Supervision: Staff to assist with self feeding;Full supervision/cueing for compensatory strategies Compensations: Minimize environmental distractions;Slow rate;Small sips/bites;Follow solids with liquid Postural Changes and/or Swallow Maneuvers: Seated upright 90 degrees                Oral Care Recommendations: Oral care BID Follow up  Recommendations: Skilled Nursing facility SLP Visit Diagnosis: Dysphagia, oropharyngeal phase (R13.12) Plan: Continue with current plan of care       GO                Osie Bond., M.A. [place] Acute Rehabilitation Services Pager [phone] Office [phone]   04/30/2019, 2:47 PM

## 2019-04-30 NOTE — TOC Progression Note (Signed)
Transition of Care [facility]) - Progression Note    Patient Details  Name: Christina Buck MRN: [id number] Date of Birth: May 14, 1950  [facility] CM/SW Contact  Purcell Mouton, RN Phone Number: 04/30/2019, 5:03 PM  Clinical Narrative:    Spoke with pt's son Vincente Liberty concerning pt's discharge plans. Plans are for pt to go back to [facility]. A call to [facility] was made with no answer will try later.         Expected Discharge Plan and Services                                                 Social Determinants of Health (SDOH) Interventions    Readmission Risk Interventions No flowsheet data found.

## 2019-04-30 NOTE — Progress Notes (Signed)
No change from am assessment. Patient appears to be A&O to self only. Pt actively pulling at lines and tubes. Telemetry and oxygen replaced. Oxygen saturation without O2 84%. O2 reapplied at 3 liters, sat increased 95 %. Mittens reapplied as pt continues to remove. Will continue to monitor and update provider.

## 2019-04-30 NOTE — Progress Notes (Signed)
Call placed to patients son Ricka Burdock regarding plan of care. Patients son reports having an apple computer and request to face time when he obtains the phone number for the device. Son to follow up with number.

## 2019-04-30 NOTE — Progress Notes (Signed)
PHARMACIST - PHYSICIAN COMMUNICATION  CONCERNING: Antibiotic IV to Oral Route Change Policy  RECOMMENDATION: This patient is receiving azithromycin by the intravenous route.  Based on criteria approved by the Pharmacy and Therapeutics Committee, the antibiotic(s) is/are being converted to the equivalent oral dose form(s).   DESCRIPTION: These criteria include:  Patient being treated for a respiratory tract infection, urinary tract infection, cellulitis or clostridium difficile associated diarrhea if on metronidazole  The patient is not neutropenic and does not exhibit a GI malabsorption state  The patient is eating (either orally or via tube) and/or has been taking other orally administered medications for a least 24 hours  The patient is improving clinically and has a Tmax < 100.5  If you have questions about this conversion, please contact the Pharmacy Department  []  ( [phone] )  [place] []  ( [phone] )  [facility] []  ( [phone]  [place] []  ( [phone]  [facility] [x]  ( [phone] )  [facility]  

## 2019-04-30 NOTE — Care Management Important Message (Signed)
Important Message  Patient Details IM Letter given to Gabriel Earing RN Case Manager to present to the Patient Name: Christina Buck MRN: [id number] Date of Birth: May 23, 1950   Medicare Important Message Given:  Yes     Kerin Salen 04/30/2019, 11:47 AM

## 2019-04-30 NOTE — Progress Notes (Signed)
PROGRESS NOTE  [facility]  DOB: 1/3/24403/08/1950  PCP: Selina CooleyWitten, Bobby, MD NUU:[id number]  DOA: 04/26/2019  LOS: 4 days   Chief Complaint  Patient presents with  . COVID+   Brief narrative: Christina Buck is a 68 y.o. female, with severe dementia apparently who tested covid-19 positive about 2 weeks prior to presentation. [facility] In the ED, patient was 89% on room air, afebrile.  COVID-19 PCR positive. CTA chest showed extensive patchy ground-glass attenuation in both lungs, consistent with the reported clinical history of viral pneumonia. Blood work showed WBC normal at 7.8, lactic acid normal at 1.1, procalcitonin 0.16 Pt was admitted for acute respiratory failure with hypoxia, secondary to covid-19  Subjective: Patient was seen and examined this morning.  Pleasant elderly African-American female.  Seems to have baseline dementia.  She was cheerful but not able to answer any questions to me.  Not in physical distress. No change in status essentially  Assessment/Plan: COVID pneumonia Acute respiratory failure with hypoxia -Presented with fatigue, hypoxia -CTA chest showed extensive patchy groundglass attenuation in both lungs -Treatment: 10-day course of IV dexamethasone 6 mg.  Completed 5-day course of IV remdesivir today on 12/28  -Supportive care: Vitamin C, Zinc, inhalers, Tylenol, Antitussives -benzonatate, Mucinex -Oxygen -not on supplemental oxygen at home.  89% room air on arrival.  Currently on 3 L supplemental oxygen. -Labs and biomarker trend as below. Lab Results  Component Value Date   SARSCOV2NAA POSITIVE (A) 04/26/2019    Recent Labs  Lab 04/26/19 1657 04/27/19 0328 04/28/19 0311 04/29/19 0328 04/30/19 0325  WBC 7.8 5.8 6.7 7.3 6.5   No results for input(s): DDIMER, FERRITIN, LDH, CRP in the last 72 hours.  -Biomarkers tomorrow morning.  Community-acquired pneumonia -Initial procalcitonin level  on admission was slightly elevated at 0.16. -With suspicion of secondary bacterial infection, patient was started on Rocephin 1gm iv qday, Zithromax 500mg  iv qday which we will continue.  Acute hypernatremia -sodium level peaked at 152.  We will change IV fluid, sodium level has improved.  Remains at 137 today.  Continue to monitor labs. -Repeat labs tomorrow.  Acute metabolic encephalopathy Dementia, severe/  Anxiety -Patient is cheerful but not able to answer any question appropriately.  She seems very confused.  She has a history of dementia.  Additionally, acutely she has Covid pneumonia as well as hyponatremia.  Expect some improvement in mental status back to baseline.  For now continue Zoloft and trazodone.   Abnromal liver function Negative acute hepatitis panel Normal RUQ ultrasound Repeat cmp in am  Elevated troponin  -Only slightly elevated troponin.  Likely 1 ischemia.  Echo with EF 60 to 65%, no WMA  R mediastinal / hilar adenopathy Please repeat CT chest in 3 months per radiology recommendations  Severe protein calorie malnutrition Start Pro-stat 30mL po bid  Chronic anemia Cont ferrous sulfate Repeat cbc in am  GERD Cont PPI  ? Peripheral edema Keep Lasix on hold.  Mobility: Encourage ambulation.  PT eval ordered. Diet: Dysphagia 1 diet.  Speech therapy eval Fluid: D5W at 17100ml/h.  DVT prophylaxis:  Lovenox subcu Code Status:  Full code Family Communication:  Not at bedside.  Updated patient's son yesterday. Expected Discharge:  Remdesivir course completed on 12/28.  Hoping to discharge back to SNF tomorrow.  Consultants:  None  Procedures:  None  Antimicrobials: Anti-infectives (From admission, onward)   Start     Dose/Rate Route Frequency Ordered Stop  05/01/19 1000  azithromycin (ZITHROMAX) tablet 500 mg     500 mg Oral Daily 04/30/19 0922     04/27/19 1000  remdesivir 100 mg in sodium chloride 0.9 % 100 mL IVPB     100 mg 200 mL/hr  over 30 Minutes Intravenous Daily 04/26/19 1919 04/30/19 0941   04/27/19 0200  azithromycin (ZITHROMAX) 500 mg in sodium chloride 0.9 % 250 mL IVPB  Status:  Discontinued     500 mg 250 mL/hr over 60 Minutes Intravenous Daily 04/27/19 0052 04/30/19 0922   04/27/19 0100  cefTRIAXone (ROCEPHIN) 1 g in sodium chloride 0.9 % 100 mL IVPB     1 g 200 mL/hr over 30 Minutes Intravenous Daily at bedtime 04/27/19 0052     04/26/19 2000  remdesivir 200 mg in sodium chloride 0.9% 250 mL IVPB     200 mg 580 mL/hr over 30 Minutes Intravenous Once 04/26/19 1919 04/26/19 2111        Code Status: Full Code   Diet Order            DIET - DYS 1 Room service appropriate? Yes; Fluid consistency: Thin  Diet effective now              Infusions:  . sodium chloride    . cefTRIAXone (ROCEPHIN)  IV 1 g (04/29/19 2041)  . dextrose Stopped (04/30/19 1324)    Scheduled Meds: . aspirin  325 mg Oral Daily  . [START ON 05/01/2019] azithromycin  500 mg Oral Daily  . dexamethasone (DECADRON) injection  6 mg Intravenous Q24H  . enoxaparin (LOVENOX) injection  40 mg Subcutaneous Q12H  . feeding supplement (ENSURE ENLIVE)  237 mL Oral BID BM  . feeding supplement (PRO-STAT SUGAR FREE 64)  30 mL Oral BID  . ferrous sulfate  325 mg Oral QAC breakfast  . Melatonin  3 mg Oral QHS  . pantoprazole  40 mg Oral Daily  . potassium chloride  10 mEq Oral Daily  . sertraline  50 mg Oral Daily  . traZODone  50 mg Oral QHS    PRN meds: sodium chloride, acetaminophen, hydrOXYzine   Objective: Vitals:   04/30/19 0851 04/30/19 1306  BP: (!) 148/82 132/86  Pulse: 75 85  Resp: 16 20  Temp:  98.3 F (36.8 C)  SpO2:      Intake/Output Summary (Last 24 hours) at 04/30/2019 1506 Last data filed at 04/30/2019 1429 Gross per 24 hour  Intake 1978.26 ml  Output 550 ml  Net 1428.26 ml   Filed Weights   04/26/19 2241  Weight: 66.8 kg   Weight change:  Body mass index is 25.28 kg/m.   Physical Exam: General  exam: Appears calm and comfortable. Dementia at baseline. Unable to follow commands.  On low-flow oxygen. Skin: No rashes, lesions or ulcers. HEENT: Atraumatic, normocephalic, supple neck, no obvious bleeding Lungs: Clear to auscultation bilaterally CVS: Regular rate and rhythm, no murmur GI/Abd soft, nontender, nondistended, bowel sound present CNS: Alert, awake, cheerful, demented, unable to follow command Psychiatry: Mood appropriate.  Cheerfully demented Extremities: No pedal edema, no calf tenderness  Data Review: I have personally reviewed the laboratory data and studies available.  Recent Labs  Lab 04/26/19 1657 04/27/19 0328 04/28/19 0311 04/29/19 0328 04/30/19 0325  WBC 7.8 5.8 6.7 7.3 6.5  NEUTROABS 6.1 4.7 5.3 5.6 4.9  HGB 10.8* 10.3* 9.9* 10.7* 10.1*  HCT 34.9* 33.8* 32.6* 35.9* 33.6*  MCV 102.9* 105.6* 105.8* 107.2* 106.7*  PLT 394 375 398  439* 448*   Recent Labs  Lab 04/26/19 1657 04/27/19 0328 04/28/19 0311 04/29/19 0328 04/30/19 0325  NA 145 149* 152* 147* 147*  K 2.9* 3.9 3.8 3.8 4.2  CL 108 115* 116* 116* 116*  CO2 24 23 23  21* 25  GLUCOSE 108* 120* 137* 125* 139*  BUN 18 18 22 18 18   CREATININE 0.76 0.69 0.73 0.77 0.65  CALCIUM 8.6* 8.3* 8.2* 8.2* 7.9*    , MD  [facility] 04/30/2019

## 2019-05-01 LAB — CBC WITH DIFFERENTIAL/PLATELET
Abs Immature Granulocytes: 0.17 10*3/uL — ABNORMAL HIGH (ref 0.00–0.07)
Basophils Absolute: 0 10*3/uL (ref 0.0–0.1)
Basophils Relative: 0 %
Eosinophils Absolute: 0 10*3/uL (ref 0.0–0.5)
Eosinophils Relative: 0 %
HCT: 34.4 % — ABNORMAL LOW (ref 36.0–46.0)
Hemoglobin: 10.2 g/dL — ABNORMAL LOW (ref 12.0–15.0)
Immature Granulocytes: 2 %
Lymphocytes Relative: 13 %
Lymphs Abs: 1 10*3/uL (ref 0.7–4.0)
MCH: 32.3 pg (ref 26.0–34.0)
MCHC: 29.7 g/dL — ABNORMAL LOW (ref 30.0–36.0)
MCV: 108.9 fL — ABNORMAL HIGH (ref 80.0–100.0)
Monocytes Absolute: 0.3 10*3/uL (ref 0.1–1.0)
Monocytes Relative: 4 %
Neutro Abs: 6.2 10*3/uL (ref 1.7–7.7)
Neutrophils Relative %: 81 %
Platelets: 415 10*3/uL — ABNORMAL HIGH (ref 150–400)
RBC: 3.16 MIL/uL — ABNORMAL LOW (ref 3.87–5.11)
RDW: 13.6 % (ref 11.5–15.5)
WBC: 7.7 10*3/uL (ref 4.0–10.5)
nRBC: 0.3 % — ABNORMAL HIGH (ref 0.0–0.2)

## 2019-05-01 LAB — CULTURE, BLOOD (ROUTINE X 2)
Culture: NO GROWTH
Culture: NO GROWTH

## 2019-05-01 LAB — COMPREHENSIVE METABOLIC PANEL
ALT: 61 U/L — ABNORMAL HIGH (ref 0–44)
AST: 61 U/L — ABNORMAL HIGH (ref 15–41)
Albumin: 2.5 g/dL — ABNORMAL LOW (ref 3.5–5.0)
Alkaline Phosphatase: 64 U/L (ref 38–126)
Anion gap: 7 (ref 5–15)
BUN: 15 mg/dL (ref 8–23)
CO2: 21 mmol/L — ABNORMAL LOW (ref 22–32)
Calcium: 7.6 mg/dL — ABNORMAL LOW (ref 8.9–10.3)
Chloride: 113 mmol/L — ABNORMAL HIGH (ref 98–111)
Creatinine, Ser: 0.66 mg/dL (ref 0.44–1.00)
GFR calc Af Amer: >60 mL/min (ref >60)
GFR calc non Af Amer: >60 mL/min (ref >60)
Glucose, Bld: 147 mg/dL — ABNORMAL HIGH (ref 70–99)
Potassium: 3.9 mmol/L (ref 3.5–5.1)
Sodium: 141 mmol/L (ref 135–145)
Total Bilirubin: 0.8 mg/dL (ref 0.3–1.2)
Total Protein: 5.6 g/dL — ABNORMAL LOW (ref 6.5–8.1)

## 2019-05-01 LAB — D-DIMER, QUANTITATIVE: D-Dimer, Quant: 1.99 ug/mL-FEU — ABNORMAL HIGH (ref 0.00–0.50)

## 2019-05-01 LAB — LACTATE DEHYDROGENASE: LDH: 390 U/L — ABNORMAL HIGH (ref 98–192)

## 2019-05-01 LAB — FERRITIN: Ferritin: 362 ng/mL — ABNORMAL HIGH (ref 11–307)

## 2019-05-01 MED ORDER — ENSURE ENLIVE PO LIQD: 237.0000 mL | Freq: Two times a day (BID) | ORAL | 12 refills | Status: AC

## 2019-05-01 MED ORDER — DEXAMETHASONE 6 MG PO TABS: 6.0000 mg | ORAL_TABLET | Freq: Every day | ORAL | 0 refills | Status: AC

## 2019-05-01 MED ORDER — PRO-STAT SUGAR FREE PO LIQD: 30.0000 mL | Freq: Two times a day (BID) | ORAL | 0 refills | Status: AC

## 2019-05-01 MED ORDER — CEFDINIR 300 MG PO CAPS: 300.0000 mg | ORAL_CAPSULE | Freq: Two times a day (BID) | ORAL | 0 refills | Status: AC

## 2019-05-01 MED ORDER — SACCHAROMYCES BOULARDII 250 MG PO CAPS: 250.0000 mg | ORAL_CAPSULE | Freq: Two times a day (BID) | ORAL | Status: AC

## 2019-05-01 NOTE — Discharge Summary (Addendum)
Physician Discharge Summary  Christina Buck FAO:[id number] DOB: [birth year] DOA: 04/26/2019  PCP: Christina Kid, MD  Admit date: 04/26/2019 Discharge date: 05/01/2019  Admitted From: ALF Discharge disposition: ALF, memory care unit   Code Status: Full Code  Diet Recommendation: Dysphagia 1 diet, thin liquid  Recommendations for Outpatient Follow-Up:   1. Follow-up with PCP.  Discharge Diagnosis:   Principal Problem:   Acute respiratory failure with hypoxia (HCC) Active Problems:   COVID-19 virus infection   Protein-calorie malnutrition, severe (HCC)   Hypokalemia   History of Present Illness / Brief narrative:  Christina Buck 68 y.o.female,with severe dementia apparently who tested covid-19 positive about 2 weeks prior to presentation. Patient presented to the ED on 12/24 with fatigue from [facility]. In the ED, patient was 89% on room air, afebrile.  COVID-19 PCR positive. CTA chest showed extensive patchy ground-glass attenuation in both lungs, consistent with the reported clinical history of viral pneumonia. Blood work showed WBC normal at 7.8, lactic acid normal at 1.1, procalcitonin 0.16 Pt was admitted for acute respiratory failure with hypoxia, secondary to [facility] Course:  COVID pneumonia Acute respiratory failure with hypoxia -Presented with fatigue, hypoxia -CTA chest showed extensive patchy groundglass attenuation in both lungs -Treatment: Currently on 10-day course of IV dexamethasone 6 mg.  Completed 5-day course of IV remdesivir today on 12/28  -Supportive care: Vitamin C, Zinc, inhalers, Tylenol, Antitussives -benzonatate, Mucinex -Oxygen -not on supplemental oxygen at home.  89% room air on arrival.  Currently on 3 L supplemental oxygen. -Labs and biomarker trend as below.  Lab Results  Component Value Date   SARSCOV2NAA POSITIVE (A) 04/26/2019    Recent Labs  Lab 04/27/19 0328 04/28/19 0311 04/29/19 0328 04/30/19 0325  05/01/19 0259  WBC 5.8 6.7 7.3 6.5 7.7   Recent Labs    05/01/19 0259  DDIMER 1.99*  FERRITIN 362*  LDH 390*   Community-acquired pneumonia -Initial procalcitonin level on admission was slightly elevated at 0.16. -With suspicion of secondary bacterial infection, patient was started on Rocephin 1gm iv qday, Zithromax 500mg  iv qday which we will continue.  Completed course of Zithromax.  Continue Omnicef for next 5 days with probiotics.  Acute hypernatremia -sodium level peaked at 152.  We will change IV fluid, sodium level has improved down to normal.  Acute metabolic encephalopathy Dementia, severe/ Anxiety -Patient is cheerful but not able to answer any question appropriately.  She seems very confused.  She has a history of dementia.  Additionally, acutely she has Covid pneumonia as well as hyponatremia.  Expect some improvement in mental status back to baseline.  For now continue Zoloft and trazodone.   Abnromal liver function Negative acute hepatitis panel Normal RUQ ultrasound Repeat cmp in am  Elevated troponin  -Only slightly elevated troponin.  Likely 1 ischemia.  Echo with EF 60 to 65%, no WMA  R mediastinal / hilar adenopathy Please repeat CT chest in 3 months per radiology recommendations  Severe protein calorie malnutrition Start Pro-stat 43mL po bid  Chronic microcytic anemia -Unclear cause of anemia.  No acute bleeding.  Ferritin level elevated.  No suspicion of iron deficiency.  Macrocytosis noted.  Can check vitamin B12 and folate as an outpatient.   GERD Cont PPI  ? Peripheral edema On Lasix to resume post discharge.  Okay to return back to assisted living facility, memory care.   Subjective:  Seen and examined this morning.  Elderly African-American female.  Cheerful.  Not in distress alert, awake, unable  to follow any commands  Discharge Exam:   Vitals:   04/30/19 1306 04/30/19 1939 05/01/19 0533 05/01/19 1437  BP: 132/86 115/65  118/73 119/65  Pulse: 85 90 79 75  Resp: Temp: 98.3 F (36.8 C) 98.2 F (36.8 C) 97.8 F (36.6 C) 98 F (36.7 C)  TempSrc: Oral Oral Oral Axillary  SpO2: 96% 91% 95% 97%  Weight:      Height:        Body mass index is 25.28 kg/m.  General exam: Appears calm and comfortable.  Skin: No rashes, lesions or ulcers. HEENT: Atraumatic, normocephalic, supple neck, no obvious bleeding Lungs: Clear to auscultate bilaterally CVS: Regular rate and rhythm, no murmur GI/Abd soft, nontender, nondistended, bowel sound present CNS: Alert, awake, unable to answer any questions.  Not oriented to time place or person. Psychiatry: Mood appropriate Extremities: No pedal edema, no calf tenderness  Discharge Instructions:  Wound care: None Discharge Instructions    For home use only DME oxygen   Complete by: As directed    Length of Need: Lifetime   Mode or (Route): Nasal cannula   Liters per Minute: 3   Frequency: Continuous (stationary and portable oxygen unit needed)   Oxygen conserving device: Yes   Oxygen delivery system: Gas   Increase activity slowly   Complete by: As directed       Allergies as of 05/01/2019   No Known Allergies     Medication List    TAKE these medications   acetaminophen 500 MG tablet Commonly known as: TYLENOL Take 500 mg by mouth every 6 (six) hours as needed for pain.   aspirin 325 MG tablet Take 325 mg by mouth daily.   cefdinir 300 MG capsule Commonly known as: OMNICEF Take 1 capsule (300 mg total) by mouth 2 (two) times daily for 5 days.   dexamethasone 6 MG tablet Commonly known as: Decadron Take 1 tablet (6 mg total) by mouth daily for 5 days.   feeding supplement (ENSURE ENLIVE) Liqd Take 237 mLs by mouth 2 (two) times daily between meals.   feeding supplement (PRO-STAT SUGAR FREE 64) Liqd Take 30 mLs by mouth 2 (two) times daily.   ferrous sulfate 325 (65 FE) MG EC tablet Take 325 mg by mouth daily before breakfast.    furosemide 20 MG tablet Commonly known as: LASIX Take 20 mg by mouth daily.   hydrOXYzine 10 MG tablet Commonly known as: ATARAX/VISTARIL Take 10 mg by mouth every 8 (eight) hours as needed for anxiety.   Melatonin 3 MG Tabs Take 3 mg by mouth at bedtime.   omeprazole 20 MG capsule Commonly known as: PRILOSEC Take 20 mg by mouth daily.   potassium chloride 10 MEQ tablet Commonly known as: KLOR-CON Take 10 mEq by mouth daily.   saccharomyces boulardii 250 MG capsule Commonly known as: FLORASTOR Take 1 capsule (250 mg total) by mouth 2 (two) times daily for 5 days.   sertraline 50 MG tablet Commonly known as: ZOLOFT Take 50 mg by mouth daily.   traZODone 50 MG tablet Commonly known as: DESYREL Take 50 mg by mouth at bedtime.            Durable Medical Equipment  (From admission, onward)         Start     Ordered   05/01/19 0000  For home use only DME oxygen    Question Answer Comment  Length of Need Lifetime   Mode or (  Route) Nasal cannula   Liters per Minute 3   Frequency Continuous (stationary and portable oxygen unit needed)   Oxygen conserving device Yes   Oxygen delivery system Gas      05/01/19 1625          Time coordinating discharge: 35 minutes  The results of significant diagnostics from this hospitalization (including imaging, microbiology, ancillary and laboratory) are listed below for reference.    Procedures and Diagnostic Studies:   CT ANGIO CHEST PE W OR WO CONTRAST  Result Date: 04/26/2019 CLINICAL DATA:  Elevated D-dimer.  COVID-19 positive. EXAM: CT ANGIOGRAPHY CHEST WITH CONTRAST TECHNIQUE: Multidetector CT imaging of the chest was performed using the standard protocol during bolus administration of intravenous contrast. Multiplanar CT image reconstructions and MIPs were obtained to evaluate the vascular anatomy. CONTRAST:  85mL OMNIPAQUE IOHEXOL 350 MG/ML SOLN COMPARISON:  None. FINDINGS: Cardiovascular: Heart is enlarged. No  substantial pericardial effusion. Coronary artery calcification is evident. Atherosclerotic calcification is noted in the wall of the thoracic aorta. Evaluation of segmental and subsegmental pulmonary arteries to the lower lobes is substantially limited by breathing motion during image acquisition. There is no large central pulmonary embolus in the main pulmonary arteries or lobar pulmonary arteries. Mediastinum/Nodes: Upper normal prevascular node is associated with a 1.3 cm short axis subcarinal lymph node and a 1.5 cm short axis lymph node in the right hilum. No evidence for left hilar lymphadenopathy. The esophagus has normal imaging features. There is no axillary lymphadenopathy. Lungs/Pleura: Patchy ground-glass opacity is seen in all lobes of both lungs, consistent with reported history of viral pneumonia. No pleural effusion. No overtly suspicious nodule or mass although assessment limited by motion and background parenchymal disease. Upper Abdomen: Left renal cysts incompletely visualized. Tiny hypodensity in the lateral segment left liver is too small to characterize. Musculoskeletal: No worrisome lytic or sclerotic osseous abnormality. Review of the MIP images confirms the above findings. IMPRESSION: 1. Motion degraded study shows no large central pulmonary embolus. Segmental and subsegmental pulmonary arteries to the lower lobes may not be reliably evaluated due to motion artifact. 2. Extensive patchy ground-glass attenuation in both lungs, consistent with the reported clinical history of viral pneumonia. 3. Mediastinal and right hilar lymphadenopathy. Presumably reactive. Follow-up chest CT in 3 months could be used to ensure resolution. 4.  Aortic Atherosclerois (ICD10-170.0) Electronically Signed   By: Kennith CenterEric  Mansell M.D.   On: 04/26/2019 19:59   DG Chest Portable 1 View  Result Date: 04/26/2019 CLINICAL DATA:  Altered mental status, COVID-19, fatigue, dementia EXAM: PORTABLE CHEST 1 VIEW  COMPARISON:  Portable exam 1507 hours without priors for comparison FINDINGS: Enlargement of cardiac silhouette. Atherosclerotic calcification aorta. Mediastinal contours normal. Patchy airspace infiltrates in mid to lower lungs bilaterally consistent with multifocal pneumonia and history of COVID-19. No pleural effusion or pneumothorax. Bones demineralized. IMPRESSION: Patchy BILATERAL pulmonary infiltrates at mid to lower lungs consistent with multifocal pneumonia and history of COVID-19. Mild enlargement of cardiac silhouette. Aortic Atherosclerosis (ICD10-I70.0). Electronically Signed   By: Ulyses SouthwardMark  Boles M.D.   On: 04/26/2019 15:41   ECHOCARDIOGRAM COMPLETE  Result Date: 04/27/2019   ECHOCARDIOGRAM REPORT   Patient Name:   Christina Buck Date of Exam: 16/10/960412/25/2020 Medical Rec #:  [id number]      Height:       64.0 in Accession #:    [id number]     Weight:       147.3 lb Date of Birth:  09/20/1950  BSA:          1.72 m Patient Age:    68 years       BP:           127/79 mmHg Patient Gender: F              HR:           90 bpm. Exam Location:  Inpatient Procedure: 2D Echo, Cardiac Doppler and Color Doppler Indications:    Elevated troponin  History:        Patient has no prior history of Echocardiogram examinations.                 Risk Factors:Current Smoker.  Sonographer:    Tonia Ghent RDCS Referring Phys: [id number] Pearson Grippe  Sonographer Comments: Covid positive IMPRESSIONS  1. Left ventricular ejection fraction, by visual estimation, is 60 to 65%. The left ventricle has normal function. There is no left ventricular hypertrophy.  2. Elevated left ventricular end-diastolic pressure.  3. Left ventricular diastolic parameters are consistent with Grade I diastolic dysfunction (impaired relaxation).  4. The left ventricle has no regional wall motion abnormalities.  5. Global right ventricle has normal systolic function.The right ventricular size is normal. No increase in right ventricular wall thickness.  6.  Left atrial size was normal.  7. Right atrial size was normal.  8. The mitral valve is grossly normal. Trivial mitral valve regurgitation.  9. The tricuspid valve is grossly normal. 10. The aortic valve is tricuspid. Aortic valve regurgitation is mild. 11. Pulmonic regurgitation is mild. 12. The pulmonic valve was grossly normal. Pulmonic valve regurgitation is mild. 13. Normal pulmonary artery systolic pressure. 14. The inferior vena cava is normal in size with greater than 50% respiratory variability, suggesting right atrial pressure of 3 mmHg. FINDINGS  Left Ventricle: Left ventricular ejection fraction, by visual estimation, is 60 to 65%. The left ventricle has normal function. The left ventricle has no regional wall motion abnormalities. The left ventricular internal cavity size was the left ventricle is normal in size. There is no left ventricular hypertrophy. Left ventricular diastolic parameters are consistent with Grade I diastolic dysfunction (impaired relaxation). Elevated left ventricular end-diastolic pressure. Right Ventricle: The right ventricular size is normal. No increase in right ventricular wall thickness. Global RV systolic function is has normal systolic function. The tricuspid regurgitant velocity is 2.39 m/s, and with an assumed right atrial pressure  of 3 mmHg, the estimated right ventricular systolic pressure is normal at 25.8 mmHg. Left Atrium: Left atrial size was normal in size. Right Atrium: Right atrial size was normal in size Pericardium: There is no evidence of pericardial effusion. Mitral Valve: The mitral valve is grossly normal. Trivial mitral valve regurgitation. Tricuspid Valve: The tricuspid valve is grossly normal. Tricuspid valve regurgitation is mild. Aortic Valve: The aortic valve is tricuspid. Aortic valve regurgitation is mild. Pulmonic Valve: The pulmonic valve was grossly normal. Pulmonic valve regurgitation is mild. Pulmonic regurgitation is mild. Aorta: The aortic  root is normal in size and structure. Venous: The inferior vena cava is normal in size with greater than 50% respiratory variability, suggesting right atrial pressure of 3 mmHg. IAS/Shunts: No atrial level shunt detected by color flow Doppler.  LEFT VENTRICLE PLAX 2D LVIDd:         4.50 cm  Diastology LVIDs:         3.00 cm  LV e' lateral:   11.20 cm/s LV PW:  0.60 cm  LV E/e' lateral: 10.2 LV IVS:        0.60 cm  LV e' medial:    6.42 cm/s LVOT diam:     1.90 cm  LV E/e' medial:  17.8 LV SV:         57 ml LV SV Index:   32.84 LVOT Area:     2.84 cm  RIGHT VENTRICLE RV S prime:     9.95 cm/s TAPSE (M-mode): 2.3 cm LEFT ATRIUM             Index       RIGHT ATRIUM           Index LA diam:        3.10 cm 1.80 cm/m  RA Area:     10.40 cm LA Vol (A2C):   27.1 ml 15.78 ml/m RA Volume:   20.40 ml  11.88 ml/m LA Vol (A4C):   31.8 ml 18.51 ml/m LA Biplane Vol: 29.8 ml 17.35 ml/m  AORTIC VALVE LVOT Vmax:   124.00 cm/s LVOT Vmean:  84.000 cm/s LVOT VTI:    0.248 m  AORTA Ao Root diam: 2.70 cm MITRAL VALVE                         TRICUSPID VALVE MV Area (PHT): 4.86 cm              TR Peak grad:   22.8 mmHg MV PHT:        45.24 msec            TR Vmax:        239.00 cm/s MV Decel Time: 156 msec MV E velocity: 114.00 cm/s 103 cm/s  SHUNTS MV A velocity: 136.00 cm/s 70.3 cm/s Systemic VTI:  0.25 m MV E/A ratio:  0.84        1.5       Systemic Diam: 1.90 cm  Prentice Docker MD Electronically signed by Prentice Docker MD Signature Date/Time: 04/27/2019/9:22:46 AM    Final    US Abdomen Limited RUQ  Result Date: 04/27/2019 CLINICAL DATA:  Abnormal liver function.  COVID-19 positive. EXAM: ULTRASOUND ABDOMEN LIMITED RIGHT UPPER QUADRANT COMPARISON:  None. FINDINGS: Gallbladder: No evidence of cholelithiasis or sludge. Echogenic focus along the gallbladder wall with comet tail artifact compatible with mild adenomyomatosis. No adjacent free fluid. Negative sonographic Murphy sign. Common bile duct: Diameter: 3  mm. Liver: No focal lesion identified. Within normal limits in parenchymal echogenicity. Portal vein is patent on color Doppler imaging with normal direction of blood flow towards the liver. Other: Partial visualization of the right kidney demonstrates suggestion of parapelvic cysts versus mild hydronephrosis. IMPRESSION: No acute hepatobiliary findings. Electronically Signed   By: Elberta Fortis M.D.   On: 04/27/2019 08:19     Labs:   Basic Metabolic Panel: Recent Labs  Lab 04/27/19 0328 04/28/19 0311 04/29/19 0328 04/30/19 0325 05/01/19 0259  NA 149* 152* 147* 147* 141  K 3.9 3.8 3.8 4.2 3.9  CL 115* 116* 116* 116* 113*  CO2 23 23 21* 25 21*  GLUCOSE 120* 137* 125* 139* 147*  BUN CREATININE 0.69 0.73 0.77 0.65 0.66  CALCIUM 8.3* 8.2* 8.2* 7.9* 7.6*   GFR Estimated Creatinine Clearance: 61.5 mL/min (by C-G formula based on SCr of 0.66 mg/dL). Liver Function Tests: Recent Labs  Lab 04/27/19 0328 04/28/19 0311 04/29/19 0328 04/30/19 0325 05/01/19 0259  AST 67*  48* 43* 100* 61*  ALT 48* 41 40 64* 61*  ALKPHOS 68 65 63 61 64  BILITOT 1.2 0.7 0.7 1.4* 0.8  PROT 6.7 6.1* 6.2* 5.6* 5.6*  ALBUMIN 3.0* 2.5* 2.6* 2.5* 2.5*   No results for input(s): LIPASE, AMYLASE in the last 168 hours. No results for input(s): AMMONIA in the last 168 hours. Coagulation profile No results for input(s): INR, PROTIME in the last 168 hours.  CBC: Recent Labs  Lab 04/27/19 0328 04/28/19 0311 04/29/19 0328 04/30/19 0325 05/01/19 0259  WBC 5.8 6.7 7.3 6.5 7.7  NEUTROABS 4.7 5.3 5.6 4.9 6.2  HGB 10.3* 9.9* 10.7* 10.1* 10.2*  HCT 33.8* 32.6* 35.9* 33.6* 34.4*  MCV 105.6* 105.8* 107.2* 106.7* 108.9*  PLT 375 398 439* 448* 415*   Cardiac Enzymes: Recent Labs  Lab 04/27/19 0328  CKTOTAL 627*  CKMB 7.0*   BNP: Invalid input(s): POCBNP CBG: No results for input(s): GLUCAP in the last 168 hours. D-Dimer Recent Labs    05/01/19 0259  DDIMER 1.99*   Hgb A1c No  results for input(s): HGBA1C in the last 72 hours. Lipid Profile No results for input(s): CHOL, HDL, LDLCALC, TRIG, CHOLHDL, LDLDIRECT in the last 72 hours. Thyroid function studies No results for input(s): TSH, T4TOTAL, T3FREE, THYROIDAB in the last 72 hours.  Invalid input(s): FREET3 Anemia work up Recent Labs    05/01/19 0259  FERRITIN 362*   Microbiology Recent Results (from the past 240 hour(s))  Blood Culture (routine x 2)     Status: None   Collection Time: 04/26/19  4:32 PM   Specimen: BLOOD  Result Value Ref Range Status   Specimen Description   Final    BLOOD RIGHT ANTECUBITAL Performed at [facility], [address] [place], [place] [address]    Special Requests   Final    BOTTLES DRAWN AEROBIC AND ANAEROBIC Blood Culture results may not be optimal due to an excessive volume of blood received in culture bottles Performed at [facility], [address], [place], [place] [address]    Culture   Final    NO GROWTH 5 DAYS Performed at [facility], [address], [place], [place] [address]    Report Status 05/01/2019 FINAL  Final  Blood Culture (routine x 2)     Status: None   Collection Time: 04/26/19  4:42 PM   Specimen: BLOOD  Result Value Ref Range Status   Specimen Description   Final    BLOOD LEFT ANTECUBITAL Performed at [facility], [address] [place], [place] [address]    Special Requests   Final    BOTTLES DRAWN AEROBIC ONLY Blood Culture results may not be optimal due to an inadequate volume of blood received in culture bottles Performed at [facility], [address], [place], [place] [address]    Culture   Final    NO GROWTH 5 DAYS Performed at [facility], [address], [place], [place] [address]    Report Status 05/01/2019 FINAL  Final  SARS CORONAVIRUS 2 (TAT 6-24 HRS) Nasopharyngeal Nasopharyngeal Swab     Status: Abnormal   Collection Time:  04/26/19  8:40 PM   Specimen: Nasopharyngeal Swab  Result Value Ref Range Status   SARS Coronavirus 2 POSITIVE (A) NEGATIVE Final    Comment: RESULT CALLED TO, READ BACK BY AND VERIFIED WITH: S.JOHNSON,RN 0449 04/27/19 G.MCADOO (NOTE) SARS-CoV-2 target nucleic acids are DETECTED. The SARS-CoV-2  RNA is generally detectable in upper and lower respiratory specimens during the acute phase of infection. Positive results are indicative of the presence of SARS-CoV-2 RNA. Clinical correlation with patient history and other diagnostic information is  necessary to determine patient infection status. Positive results do not rule out bacterial infection or co-infection with other viruses.  The expected result is Negative. Fact Sheet for Patients: HairSlick.no Fact Sheet for Healthcare Providers: quierodirigir.com This test is not yet approved or cleared by the [place] FDA and  has been authorized for detection and/or diagnosis of SARS-CoV-2 by FDA under an Emergency Use Authorization (EUA). This EUA will remain  in effect (meaning this test can be used) for the  duration of the COVID-19 declaration under Section 564(b)(1) of the Act, 21 U.S.C. section 360bbb-3(b)(1), unless the authorization is terminated or revoked sooner. Performed at [facility], [address], [place], [place] [address]     Please note: You were cared for by a hospitalist during your hospital stay. Once you are discharged, your primary care physician will handle any further medical issues. Please note that NO REFILLS for any discharge medications will be authorized once you are discharged, as it is imperative that you return to your primary care physician (or establish a relationship with a primary care physician if you do not have one) for your post hospital discharge needs so that they can reassess your need for medications and monitor your lab  values.  Signed: Lorin Glass  [facility] 05/01/2019, 5:05 PM

## 2019-05-01 NOTE — Progress Notes (Signed)
Patients son Vincente Liberty updated regarding plan of care. Son inquired about face time call. Son made aware phone number for device would be needed to completed call. No number obtained from son at this time.

## 2019-05-01 NOTE — TOC Progression Note (Signed)
Transition of Care [facility]) - Progression Note    Patient Details  Name: Christina Buck MRN: [id number] Date of Birth: 1950/10/22  Transition of [facility] CM/SW Contact  Purcell Mouton, RN Phone Number: 05/01/2019, 5:19 PM  Clinical Narrative:    O2 is at [facility]. [facility] was called to transport back to [place].        Expected Discharge Plan and Services           Expected Discharge Date: 05/01/19                                     Social Determinants of Health (SDOH) Interventions    Readmission Risk Interventions No flowsheet data found.

## 2019-05-01 NOTE — TOC Progression Note (Signed)
Transition of Care [facility]) - Progression Note    Patient Details  Name: Christina Buck MRN: [id number] Date of Birth: 07-06-1950  Transition of Care [facility]) CM/SW Contact  Purcell Mouton, RN Phone Number: 05/01/2019, 4:22 PM  Clinical Narrative:     Pt was sent back to [facility], related no O2 at ALF. Order was placed by MD on return. O2 ordered from [place] was called. Home O2 will arrive in 20 mins to facility. [facility] was called to inform them that [facility] will deliver O2.          Expected Discharge Plan and Services           Expected Discharge Date: 05/01/19                                     Social Determinants of Health (SDOH) Interventions    Readmission Risk Interventions No flowsheet data found.

## 2019-05-01 NOTE — Progress Notes (Signed)
Patient discharge at  this time via [facility] in no acute distress. VS stable.

## 2019-05-01 NOTE — TOC Progression Note (Signed)
Transition of Care [facility]) - Progression Note    Patient Details  Name: Christina Buck MRN: [id number] Date of Birth: 1950/12/27  Transition [facility]) CM/SW Contact  Purcell Mouton, RN Phone Number: 05/01/2019, 1:53 PM  Clinical Narrative:    Spoke with admission coordinator who states pt may return. Will need discharge summary. [facility] called for transportation in one hour. RN is aware.          Expected Discharge Plan and Services                                                 Social Determinants of Health (SDOH) Interventions    Readmission Risk Interventions No flowsheet data found.

## 2019-05-01 NOTE — Progress Notes (Signed)
Call placed to [facility] [phone], no answer then directed to ext [phone] for nursing station. Pt stable at time of discharge. No change from am assessment. Secondary call placed. Report given to Alida.

## 2019-05-01 NOTE — TOC Progression Note (Signed)
Transition of Care [facility]) - Progression Note    Patient Details  Name: Christina Buck MRN: [id number] Date of Birth: June 17, 1950  Transition of Care [facility]) CM/SW Contact  Purcell Mouton, RN Phone Number: 05/01/2019, 1:58 PM  Clinical Narrative:    Son Christina Buck was called to inform him of pt transferring back to [facility].        Expected Discharge Plan and Services                                                 Social Determinants of Health (SDOH) Interventions    Readmission Risk Interventions No flowsheet data found.

## 2019-05-09 ENCOUNTER — Other Ambulatory Visit: Payer: Self-pay

## 2019-05-09 ENCOUNTER — Emergency Department (HOSPITAL_COMMUNITY): Payer: Medicare Other

## 2019-05-09 ENCOUNTER — Emergency Department (HOSPITAL_COMMUNITY)
Admission: EM | Admit: 2019-05-09 | Discharge: 2019-05-10 | Disposition: A | Discharge status: Home / Self Care | Payer: Medicare Other | Attending: Emergency Medicine | Admitting: Emergency Medicine

## 2019-05-09 DIAGNOSIS — F17210 Nicotine dependence, cigarettes, uncomplicated: Secondary | ICD-10-CM | POA: Insufficient documentation

## 2019-05-09 DIAGNOSIS — R0789 Other chest pain: Secondary | ICD-10-CM | POA: Diagnosis present

## 2019-05-09 DIAGNOSIS — Z79899 Other long term (current) drug therapy: Secondary | ICD-10-CM | POA: Insufficient documentation

## 2019-05-09 DIAGNOSIS — F0390 Unspecified dementia without behavioral disturbance: Secondary | ICD-10-CM | POA: Diagnosis not present

## 2019-05-09 DIAGNOSIS — Z8709 Personal history of other diseases of the respiratory system: Secondary | ICD-10-CM | POA: Diagnosis not present

## 2019-05-09 DIAGNOSIS — R079 Chest pain, unspecified: Secondary | ICD-10-CM

## 2019-05-09 LAB — CBC
HCT: 35.7 % — ABNORMAL LOW (ref 36.0–46.0)
Hemoglobin: 10.9 g/dL — ABNORMAL LOW (ref 12.0–15.0)
MCH: 32.3 pg (ref 26.0–34.0)
MCHC: 30.5 g/dL (ref 30.0–36.0)
MCV: 105.9 fL — ABNORMAL HIGH (ref 80.0–100.0)
Platelets: 317 10*3/uL (ref 150–400)
RBC: 3.37 MIL/uL — ABNORMAL LOW (ref 3.87–5.11)
RDW: 14.9 % (ref 11.5–15.5)
WBC: 6 10*3/uL (ref 4.0–10.5)
nRBC: 0 % (ref 0.0–0.2)

## 2019-05-09 LAB — BASIC METABOLIC PANEL
Anion gap: 12 (ref 5–15)
BUN: 12 mg/dL (ref 8–23)
CO2: 23 mmol/L (ref 22–32)
Calcium: 8.5 mg/dL — ABNORMAL LOW (ref 8.9–10.3)
Chloride: 105 mmol/L (ref 98–111)
Creatinine, Ser: 0.86 mg/dL (ref 0.44–1.00)
GFR calc Af Amer: >60 mL/min (ref >60)
GFR calc non Af Amer: >60 mL/min (ref >60)
Glucose, Bld: 108 mg/dL — ABNORMAL HIGH (ref 70–99)
Potassium: 3.1 mmol/L — ABNORMAL LOW (ref 3.5–5.1)
Sodium: 140 mmol/L (ref 135–145)

## 2019-05-09 LAB — TROPONIN I (HIGH SENSITIVITY): Troponin I (High Sensitivity): 6 ng/L (ref <18)

## 2019-05-09 MED ORDER — POTASSIUM CHLORIDE 20 MEQ/15ML (10%) PO SOLN
20.0000 meq | Freq: Once | ORAL | Status: DC
  Filled 2019-05-09: qty 15

## 2019-05-09 NOTE — Discharge Instructions (Addendum)
Your work-up today was reassuring and without any acute abnormalities. Follow up with your primary care doctor

## 2019-05-09 NOTE — ED Provider Notes (Signed)
[facility] EMERGENCY DEPARTMENT Provider Note   CSN: [id number] Arrival date & time: 05/09/19  2012     History Chief Complaint  Patient presents with   Chest Pain    Rhonda L Shelden is a 69 y.o. female.  HPI    Level 5 caveat due to dementia.  Nikkole L Shaub is a 69 y.o. female, with a history of dementia, anxiety, arthritis, COVID-19, presenting to the ED with  Patient presents from skilled nursing facility after the staff noted she was clutching her chest.  Patient denies any chest pain, shortness of breath, abdominal pain, vomiting, diarrhea, any pain or complaints.    Past Medical History:  Diagnosis Date   Anxiety    Arthritis    Dementia [facility])     Patient Active Problem List   Diagnosis Date Noted   COVID-19 virus infection 04/26/2019   Acute respiratory failure with hypoxia ([place]) 04/26/2019   Protein-calorie malnutrition, severe ([place]) 04/26/2019   Hypokalemia 04/26/2019    No past surgical history on file.   OB History   No obstetric history on file.     No family history on file.  Social History   Tobacco Use   Smoking status: Current Every Day Smoker    Packs/day: 0.50    Years: 25.00    Pack years: 12.50    Types: Cigarettes   Smokeless tobacco: Never Used  Substance Use Topics   Alcohol use: Yes    Comment: occ   Drug use: No    Home Medications Prior to Admission medications   Medication Sig Start Date End Date Taking? Authorizing Provider  acetaminophen (TYLENOL) 500 MG tablet Take 500 mg by mouth every 6 (six) hours as needed for pain. 02/23/18   [provider]  Amino Acids-Protein Hydrolys (FEEDING SUPPLEMENT, PRO-STAT SUGAR FREE 64,) LIQD Take 30 mLs by mouth 2 (two) times daily. 05/01/19   Terrilee Croak, MD  aspirin 325 MG tablet Take 325 mg by mouth daily.    [provider]  feeding supplement, ENSURE ENLIVE, (ENSURE ENLIVE) LIQD Take 237 mLs by mouth 2 (two) times daily  between meals. 05/01/19   Terrilee Croak, MD  ferrous sulfate 325 (65 FE) MG EC tablet Take 325 mg by mouth daily before breakfast. 11/29/17   [provider]  furosemide (LASIX) 20 MG tablet Take 20 mg by mouth daily. 08/31/18   [provider]  hydrOXYzine (ATARAX/VISTARIL) 10 MG tablet Take 10 mg by mouth every 8 (eight) hours as needed for anxiety.    [provider]  Melatonin 3 MG TABS Take 3 mg by mouth at bedtime. 09/06/18   [provider]  omeprazole (PRILOSEC) 20 MG capsule Take 20 mg by mouth daily.    [provider]  potassium chloride (KLOR-CON) 10 MEQ tablet Take 10 mEq by mouth daily. 08/31/18   [provider]  sertraline (ZOLOFT) 50 MG tablet Take 50 mg by mouth daily.    [provider]  traZODone (DESYREL) 50 MG tablet Take 50 mg by mouth at bedtime. 11/09/18   [provider]    Allergies    Patient has no known allergies.  Review of Systems   Review of Systems  Unable to perform ROS: Dementia    Physical Exam Updated Vital Signs BP 104/69    Pulse 85    Temp (!) 97.5 F (36.4 C) (Tympanic)    Resp 18    SpO2 97%   Physical Exam Vitals  and nursing note reviewed.  Constitutional:      General: She is not in acute distress.    Appearance: She is well-developed. She is not diaphoretic.  HENT:     Head: Normocephalic and atraumatic.     Mouth/Throat:     Mouth: Mucous membranes are moist.     Pharynx: Oropharynx is clear.  Eyes:     Conjunctiva/sclera: Conjunctivae normal.  Cardiovascular:     Rate and Rhythm: Normal rate and regular rhythm.     Pulses: Normal pulses.          Radial pulses are 2+ on the right side and 2+ on the left side.       Posterior tibial pulses are 2+ on the right side and 2+ on the left side.     Heart sounds: Normal heart sounds.     Comments: Tactile temperature in the extremities appropriate and equal bilaterally. Pulmonary:     Effort: Pulmonary effort is normal.  No respiratory distress.     Breath sounds: Normal breath sounds.  Abdominal:     Palpations: Abdomen is soft.     Tenderness: There is no abdominal tenderness. There is no guarding.  Musculoskeletal:     Cervical back: Neck supple.     Right lower leg: Edema present.     Left lower leg: Edema present.     Comments: Mild, bilateral ankle edema.  No erythema, increased warmth.  No calf tenderness or swelling.  Lymphadenopathy:     Cervical: No cervical adenopathy.  Skin:    General: Skin is warm and dry.  Neurological:     Mental Status: She is alert.  Psychiatric:        Mood and Affect: Mood and affect normal.        Speech: Speech normal.        Behavior: Behavior normal.     ED Results / Procedures / Treatments   Labs (all labs ordered are listed, but only abnormal results are displayed) Labs Reviewed  BASIC METABOLIC PANEL - Abnormal; Notable for the following components:      Result Value   Potassium 3.1 (*)    Glucose, Bld 108 (*)    Calcium 8.5 (*)    All other components within normal limits  CBC - Abnormal; Notable for the following components:   RBC 3.37 (*)    Hemoglobin 10.9 (*)    HCT 35.7 (*)    MCV 105.9 (*)    All other components within normal limits  TROPONIN I (HIGH SENSITIVITY)  TROPONIN I (HIGH SENSITIVITY)   Hemoglobin  Date Value Ref Range Status  05/09/2019 10.9 (L) 12.0 - 15.0 g/dL Final  67/34/1937 90.2 (L) 12.0 - 15.0 g/dL Final  40/97/3532 99.2 (L) 12.0 - 15.0 g/dL Final  42/68/3419 62.2 (L) 12.0 - 15.0 g/dL Final    EKG EKG Interpretation  Date/Time:  Wednesday May 09 2019 20:13:34 EST Ventricular Rate:  85 PR Interval:    QRS Duration: 139 QT Interval:  400 QTC Calculation: 476 R Axis:   4 Text Interpretation: Sinus tachycardia Ventricular premature complex Probable left atrial enlargement Right bundle branch block Borderline ST elevation, lateral leads Confirmed by Benjiman Core [phone]) on 05/09/2019 9:55:29  PM   Radiology DG Chest 2 View  Result Date: 05/09/2019 CLINICAL DATA:  Chest pain, COVID-19 positive EXAM: CHEST - 2 VIEW COMPARISON:  04/26/2019 chest radiograph. FINDINGS: Stable cardiomediastinal silhouette with normal heart size. No pneumothorax. No pleural effusion. Extensive patchy  opacities in the mid to lower lungs bilaterally, similar. IMPRESSION: Similar extensive patchy opacities in the mid to lower lungs bilaterally, compatible with COVID-19 pneumonia. Electronically Signed   By: Delbert Phenix M.D.   On: 05/09/2019 20:59    Procedures Procedures (including critical care time)  Medications Ordered in ED Medications  potassium chloride 20 MEQ/15ML (10%) solution 20 mEq (has no administration in time range)    ED Course  I have reviewed the triage vital signs and the nursing notes.  Pertinent labs & imaging results that were available during my care of the patient were reviewed by me and considered in my medical decision making (see chart for details).    MDM Rules/Calculators/A&P                      Patient presents after the facility thought that she might be having chest pain due to her being witnessed clutching her chest.  On arrival in the ED, patient denies any current or previous chest pain or other symptoms. Patient is nontoxic appearing, afebrile, not tachycardic, not tachypneic, not hypotensive, maintains excellent SPO2 on room air, and is in no apparent distress.  Chest x-ray stable from previous.  First troponin negative.  No acute abnormalities on EKG.   End of shift patient care handoff report given to Antony Madura, PA-C. Plan: Second troponin pending. If negative, patient cleared from discharge.     Final Clinical Impression(s) / ED Diagnoses Final diagnoses:  None    Rx / DC Orders ED Discharge Orders    None       Concepcion Living 05/09/19 [id number]    Benjiman Core, MD 05/15/19 [phone]

## 2019-05-09 NOTE — ED Triage Notes (Signed)
Pt was seen "clutching her chest" and called EMS for pt having CP. Pt denies pain currently (pt is from a memory care unit). Given 324 asa pta.

## 2019-05-10 DIAGNOSIS — R0789 Other chest pain: Secondary | ICD-10-CM | POA: Diagnosis not present

## 2019-05-10 LAB — TROPONIN I (HIGH SENSITIVITY): Troponin I (High Sensitivity): 6 ng/L (ref <18)

## 2019-05-10 NOTE — ED Provider Notes (Signed)
1:54 AM Delta troponin stable, negative. Patient appropriate for discharge and outpatient f/u with her PCP.   Antony Madura, PA-C 05/10/19 [id number]    Geoffery Lyons, MD 05/10/19 Cleophas Dunker

## 2019-06-05 ENCOUNTER — Emergency Department (HOSPITAL_COMMUNITY): Payer: Medicare Other

## 2019-06-05 ENCOUNTER — Other Ambulatory Visit: Payer: Self-pay

## 2019-06-05 ENCOUNTER — Emergency Department (HOSPITAL_COMMUNITY)
Admission: EM | Admit: 2019-06-05 | Discharge: 2019-06-05 | Disposition: A | Discharge status: Home / Self Care | Payer: Medicare Other | Attending: Emergency Medicine | Admitting: Emergency Medicine

## 2019-06-05 DIAGNOSIS — W19XXXA Unspecified fall, initial encounter: Secondary | ICD-10-CM | POA: Diagnosis not present

## 2019-06-05 DIAGNOSIS — Y999 Unspecified external cause status: Secondary | ICD-10-CM | POA: Diagnosis not present

## 2019-06-05 DIAGNOSIS — S0990XA Unspecified injury of head, initial encounter: Secondary | ICD-10-CM | POA: Insufficient documentation

## 2019-06-05 DIAGNOSIS — F0390 Unspecified dementia without behavioral disturbance: Secondary | ICD-10-CM | POA: Insufficient documentation

## 2019-06-05 DIAGNOSIS — Y92122 Bedroom in nursing home as the place of occurrence of the external cause: Secondary | ICD-10-CM | POA: Insufficient documentation

## 2019-06-05 DIAGNOSIS — Z79899 Other long term (current) drug therapy: Secondary | ICD-10-CM | POA: Diagnosis not present

## 2019-06-05 DIAGNOSIS — R296 Repeated falls: Secondary | ICD-10-CM

## 2019-06-05 DIAGNOSIS — Y939 Activity, unspecified: Secondary | ICD-10-CM | POA: Insufficient documentation

## 2019-06-05 LAB — CBG MONITORING, ED: Glucose-Capillary: 78 mg/dL (ref 70–99)

## 2019-06-05 NOTE — ED Provider Notes (Signed)
TIME SEEN: 2:28 AM  CHIEF COMPLAINT: Unwitnessed fall  HPI: Patient is a 69 year old female with history of dementia who presents to the emergency department with [facility] from [facility] facility after she had an unwitnessed fall.  Found on the floor by her roommate.  Patient at her neurologic baseline per staff.  Blood glucose and vitals normal with EMS.  Patient is on full dose aspirin.  No anticoagulants.    Went to bed at 9-9:30pm.  Nurse Sharyn Lull states she walked by room around 11pm and found patient in floor on right side between bed and wall.  Both arms were trembling per nurse.  EMS reports that on arrival patient did have some mild tremulousness of both upper extremities but was awake, alert without seizure activity.  There was no incontinence or tongue biting.  No history of seizures.  ROS: Level 5 caveat for dementia  PAST MEDICAL HISTORY/PAST SURGICAL HISTORY:  Past Medical History:  Diagnosis Date  . Anxiety   . Arthritis   . Dementia [facility])     MEDICATIONS:  Prior to Admission medications   Medication Sig Start Date End Date Taking? Authorizing Provider  acetaminophen (TYLENOL) 500 MG tablet Take 500 mg by mouth every 6 (six) hours as needed for pain. 02/23/18   [provider]  Amino Acids-Protein Hydrolys (FEEDING SUPPLEMENT, PRO-STAT SUGAR FREE 64,) LIQD Take 30 mLs by mouth 2 (two) times daily. 05/01/19   Terrilee Croak, MD  aspirin 325 MG tablet Take 325 mg by mouth daily.    [provider]  feeding supplement, ENSURE ENLIVE, (ENSURE ENLIVE) LIQD Take 237 mLs by mouth 2 (two) times daily between meals. 05/01/19   Terrilee Croak, MD  ferrous sulfate 325 (65 FE) MG EC tablet Take 325 mg by mouth daily before breakfast. 11/29/17   [provider]  furosemide (LASIX) 20 MG tablet Take 20 mg by mouth daily. 08/31/18   [provider]  hydrOXYzine (ATARAX/VISTARIL) 10 MG tablet Take 10 mg by mouth every 8 (eight)  hours as needed for anxiety.    [provider]  Melatonin 3 MG TABS Take 3 mg by mouth at bedtime. 09/06/18   [provider]  omeprazole (PRILOSEC) 20 MG capsule Take 20 mg by mouth daily.    [provider]  potassium chloride (KLOR-CON) 10 MEQ tablet Take 10 mEq by mouth daily. 08/31/18   [provider]  sertraline (ZOLOFT) 50 MG tablet Take 50 mg by mouth daily.    [provider]  traZODone (DESYREL) 50 MG tablet Take 50 mg by mouth at bedtime. 11/09/18   [provider]    ALLERGIES:  No Known Allergies  SOCIAL HISTORY:  Social History   Tobacco Use  . Smoking status: Current Every Day Smoker    Packs/day: 0.50    Years: 25.00    Pack years: 12.50    Types: Cigarettes  . Smokeless tobacco: Never Used  Substance Use Topics  . Alcohol use: Yes    Comment: occ    FAMILY HISTORY: No family history on file.  EXAM: BP (!) 137/113   Pulse 82   Temp 97.8 F (36.6 C) (Rectal)   Resp 16   Ht 5\' 5"  (1.651 m)   Wt 67 kg   SpO2 98%   BMI 24.58 kg/m  CONSTITUTIONAL: Alert but extremely demented and does not answer questions.  Does follow some commands.  In no distress.  Elderly. HEAD: Normocephalic; atraumatic EYES: Conjunctivae  clear, PERRL, EOMI ENT: normal nose; no rhinorrhea; moist mucous membranes; pharynx without lesions noted; no dental injury; no septal hematoma NECK: Supple, no meningismus, no LAD; no midline spinal tenderness, step-off or deformity; trachea midline, cervical collar in place CARD: RRR; S1 and S2 appreciated; no murmurs, no clicks, no rubs, no gallops RESP: Normal chest excursion without splinting or tachypnea; breath sounds clear and equal bilaterally; no wheezes, no rhonchi, no rales; no hypoxia or respiratory distress CHEST:  chest wall stable, no crepitus or ecchymosis or deformity, nontender to palpation; no flail chest ABD/GI: Normal bowel sounds; non-distended; soft, non-tender, no rebound, no  guarding; no ecchymosis or other lesions noted PELVIS:  stable, nontender to palpation BACK:  The back appears normal and is non-tender to palpation, there is no CVA tenderness; no midline spinal tenderness, step-off or deformity EXT: Normal ROM in all joints; non-tender to palpation; no edema; normal capillary refill; no cyanosis, no bony tenderness or bony deformity of patient's extremities, no joint effusion, compartments are soft, extremities are warm and well-perfused, no ecchymosis SKIN: Normal color for age and race; warm NEURO: Moves all extremities equally PSYCH: The patient's mood and manner are appropriate. Grooming and personal hygiene are appropriate.  MEDICAL DECISION MAKING: Patient here with unwitnessed fall.  No obvious sign of trauma on exam.  Normal glucose and vitals with EMS.  She does feel warm to touch here, will check rectal temperature.  Will obtain CT of the head and cervical spine.  Will recheck blood glucose.  It does not sound like patient had a seizure today by what EMS reports or what the nurse at [facility] reports.  Will monitor closely here in the ED.  ED PROGRESS: Patient is afebrile.  CT head and cervical spine show no acute abnormality.  Will remove c-collar.  Discussed with nursing staff at [facility].  I feel she is safe to be discharged home.  Have not witnessed any seizure activity.   At this time, I do not feel there is any life-threatening condition present. I have reviewed, interpreted and discussed all results (EKG, imaging, lab, urine as appropriate) and exam findings with patient/family. I have reviewed nursing notes and appropriate previous records.  I feel the patient is safe to be discharged home without further emergent workup and can continue workup as an outpatient as needed. Discussed usual and customary return precautions. Patient/family verbalize understanding and are comfortable with this plan.  Outpatient follow-up has been provided as  needed. All questions have been answered.  dies, pulse oximetry and re-evaluation of patient's condition.  Christina Buck was evaluated in Emergency Department on 06/05/2019 for the symptoms described in the history of present illness. She was evaluated in the context of the global COVID-19 pandemic, which necessitated consideration that the patient might be at risk for infection with the SARS-CoV-2 virus that causes COVID-19. Institutional protocols and algorithms that pertain to the evaluation of patients at risk for COVID-19 are in a state of rapid change based on information released by regulatory bodies including the CDC and federal and state organizations. These policies and algorithms were followed during the patient's care in the ED.  Patient was seen wearing N95, face shield, gloves.     Nyisha Clippard, Layla Maw, DO 06/05/19 [phone]

## 2019-06-05 NOTE — Discharge Instructions (Signed)
CT of the head and cervical spine showed no acute abnormality today.  Blood glucose normal.  I follow-up with primary care physician as needed.

## 2019-06-05 NOTE — ED Notes (Signed)
Report given to provider at [facility]. All questions answered

## 2019-06-05 NOTE — ED Triage Notes (Signed)
Pt coming by EMS after an unwitnessed fall at facility. Pt seemed to have bilateral  "tremors" after fall per staff, but no incont. Or oral trauma. Pt not on blood thinners. Unknown reason as to fall. Pt did hit head. Vitals 116/70, HR 80, 94%, 116 CBG. Does not communicate verbally frequently due to dementia but did say that she was cold upon arrival. States she does not have pain anywhere

## 2020-04-25 ENCOUNTER — Emergency Department (HOSPITAL_COMMUNITY)
Admission: EM | Admit: 2020-04-25 | Discharge: 2020-04-25 | Disposition: A | Discharge status: Home / Self Care | Payer: Medicare Other | Attending: Emergency Medicine | Admitting: Emergency Medicine

## 2020-04-25 ENCOUNTER — Emergency Department (HOSPITAL_COMMUNITY): Payer: Medicare Other

## 2020-04-25 DIAGNOSIS — Z7982 Long term (current) use of aspirin: Secondary | ICD-10-CM | POA: Diagnosis not present

## 2020-04-25 DIAGNOSIS — R1110 Vomiting, unspecified: Secondary | ICD-10-CM

## 2020-04-25 DIAGNOSIS — Z8616 Personal history of COVID-19: Secondary | ICD-10-CM | POA: Diagnosis not present

## 2020-04-25 DIAGNOSIS — F17210 Nicotine dependence, cigarettes, uncomplicated: Secondary | ICD-10-CM | POA: Diagnosis not present

## 2020-04-25 DIAGNOSIS — F0390 Unspecified dementia without behavioral disturbance: Secondary | ICD-10-CM | POA: Insufficient documentation

## 2020-04-25 DIAGNOSIS — R112 Nausea with vomiting, unspecified: Secondary | ICD-10-CM | POA: Insufficient documentation

## 2020-04-25 DIAGNOSIS — Z79899 Other long term (current) drug therapy: Secondary | ICD-10-CM | POA: Insufficient documentation

## 2020-04-25 LAB — URINALYSIS, ROUTINE W REFLEX MICROSCOPIC
Bilirubin Urine: NEGATIVE
Glucose, UA: NEGATIVE mg/dL
Ketones, ur: NEGATIVE mg/dL
Nitrite: NEGATIVE
Protein, ur: NEGATIVE mg/dL
Specific Gravity, Urine: 1.016 (ref 1.005–1.030)
pH: 5 (ref 5.0–8.0)

## 2020-04-25 LAB — COMPREHENSIVE METABOLIC PANEL
ALT: 18 U/L (ref 0–44)
AST: 24 U/L (ref 15–41)
Albumin: 3.9 g/dL (ref 3.5–5.0)
Alkaline Phosphatase: 87 U/L (ref 38–126)
Anion gap: 12 (ref 5–15)
BUN: 19 mg/dL (ref 8–23)
CO2: 21 mmol/L — ABNORMAL LOW (ref 22–32)
Calcium: 8.5 mg/dL — ABNORMAL LOW (ref 8.9–10.3)
Chloride: 109 mmol/L (ref 98–111)
Creatinine, Ser: 0.87 mg/dL (ref 0.44–1.00)
GFR, Estimated: >60 mL/min (ref >60)
Glucose, Bld: 110 mg/dL — ABNORMAL HIGH (ref 70–99)
Potassium: 4.3 mmol/L (ref 3.5–5.1)
Sodium: 142 mmol/L (ref 135–145)
Total Bilirubin: 0.6 mg/dL (ref 0.3–1.2)
Total Protein: 6.7 g/dL (ref 6.5–8.1)

## 2020-04-25 LAB — CBC WITH DIFFERENTIAL/PLATELET
Abs Immature Granulocytes: 0.02 10*3/uL (ref 0.00–0.07)
Basophils Absolute: 0 10*3/uL (ref 0.0–0.1)
Basophils Relative: 0 %
Eosinophils Absolute: 0 10*3/uL (ref 0.0–0.5)
Eosinophils Relative: 0 %
HCT: 39.6 % (ref 36.0–46.0)
Hemoglobin: 12.3 g/dL (ref 12.0–15.0)
Immature Granulocytes: 0 %
Lymphocytes Relative: 7 %
Lymphs Abs: 0.4 10*3/uL — ABNORMAL LOW (ref 0.7–4.0)
MCH: 33.1 pg (ref 26.0–34.0)
MCHC: 31.1 g/dL (ref 30.0–36.0)
MCV: 106.5 fL — ABNORMAL HIGH (ref 80.0–100.0)
Monocytes Absolute: 0.4 10*3/uL (ref 0.1–1.0)
Monocytes Relative: 6 %
Neutro Abs: 5 10*3/uL (ref 1.7–7.7)
Neutrophils Relative %: 87 %
Platelets: 208 10*3/uL (ref 150–400)
RBC: 3.72 MIL/uL — ABNORMAL LOW (ref 3.87–5.11)
RDW: 13.7 % (ref 11.5–15.5)
WBC: 5.9 10*3/uL (ref 4.0–10.5)
nRBC: 0 % (ref 0.0–0.2)

## 2020-04-25 LAB — LIPASE, BLOOD: Lipase: 19 U/L (ref 11–51)

## 2020-04-25 MED ORDER — ONDANSETRON 4 MG PO TBDP
4.0000 mg | ORAL_TABLET | Freq: Once | ORAL | Status: AC
  Administered 2020-04-25: 4 mg via ORAL
  Filled 2020-04-25: qty 1

## 2020-04-25 MED ORDER — ONDANSETRON HCL 4 MG/2ML IJ SOLN: 4.0000 mg | Freq: Once | INTRAMUSCULAR | Status: DC

## 2020-04-25 MED ORDER — ONDANSETRON 4 MG PO TBDP: ORAL_TABLET | ORAL | 0 refills | Status: DC

## 2020-04-25 MED ORDER — SODIUM CHLORIDE 0.9 % IV BOLUS: 1000.0000 mL | Freq: Once | INTRAVENOUS | Status: DC

## 2020-04-25 NOTE — ED Notes (Signed)
Pure wick and clean gown  placed on patient. RN applied mittens for safety. Bed alarm was turned back on as well.

## 2020-04-25 NOTE — ED Notes (Signed)
Report called to [facility] and given to [place].

## 2020-04-25 NOTE — ED Notes (Signed)
[facility] called for transport.  

## 2020-04-25 NOTE — ED Notes (Signed)
Pt continues to pull off monitoring equipment even when we try to replace it. Pt becomes very aggressive and combative with stimulation. Once you leave pt alone she is very calm.

## 2020-04-25 NOTE — ED Notes (Addendum)
Patient's bed alarm went off. Patient had woke up and was trying to get out of bed. She was very agitated and had pulled her call bell out of the wall. I convinced her to lay back down, plugged her call bell back into the wall and turned her TV volume up again. I gave her two warm blankets and stayed with her till she fell back asleep.I was only able to get a pulse and O2 reading without waking patient up.

## 2020-04-25 NOTE — ED Notes (Signed)
Dr. Silverio Lay informed that pt had applesauce with meds and has not vomited.

## 2020-04-25 NOTE — ED Provider Notes (Signed)
[facility]-EMERGENCY DEPT Provider Note   CSN: [id number] Arrival date & time: 04/25/20  1241     History Chief Complaint  Patient presents with  . Nausea  . Emesis    Christina Buck is a 70 y.o. female hx of dementia, here presenting with nausea and vomiting.  Patient is from a nursing home and is demented at baseline.  Patient was noted to have 2 episodes of vomiting at the facility.  Patient is demented unable to give much history.   The history is provided by the patient and the EMS personnel.  Level V caveat- dementia      Past Medical History:  Diagnosis Date  . Anxiety   . Arthritis   . Dementia [facility])     Patient Active Problem List   Diagnosis Date Noted  . COVID-19 virus infection 04/26/2019  . Acute respiratory failure with hypoxia (HCC) 04/26/2019  . Protein-calorie malnutrition, severe (HCC) 04/26/2019  . Hypokalemia 04/26/2019    No past surgical history on file.   OB History   No obstetric history on file.     No family history on file.  Social History   Tobacco Use  . Smoking status: Current Every Day Smoker    Packs/day: 0.50    Years: 25.00    Pack years: 12.50    Types: Cigarettes  . Smokeless tobacco: Never Used  Substance Use Topics  . Alcohol use: Yes    Comment: occ  . Drug use: No    Home Medications Prior to Admission medications   Medication Sig Start Date End Date Taking? Authorizing Provider  acetaminophen (TYLENOL) 500 MG tablet Take 500 mg by mouth every 6 (six) hours as needed for pain. 02/23/18   [provider]  Amino Acids-Protein Hydrolys (FEEDING SUPPLEMENT, PRO-STAT SUGAR FREE 64,) LIQD Take 30 mLs by mouth 2 (two) times daily. 05/01/19   Lorin Glass, MD  aspirin 325 MG tablet Take 325 mg by mouth daily.    [provider]  feeding supplement, ENSURE ENLIVE, (ENSURE ENLIVE) LIQD Take 237 mLs by mouth 2 (two) times daily between meals. 05/01/19   Lorin Glass, MD   ferrous sulfate 325 (65 FE) MG EC tablet Take 325 mg by mouth daily before breakfast. 11/29/17   [provider]  furosemide (LASIX) 20 MG tablet Take 20 mg by mouth daily. 08/31/18   [provider]  hydrOXYzine (ATARAX/VISTARIL) 10 MG tablet Take 10 mg by mouth every 8 (eight) hours as needed for anxiety.    [provider]  Melatonin 3 MG TABS Take 3 mg by mouth at bedtime. 09/06/18   [provider]  omeprazole (PRILOSEC) 20 MG capsule Take 20 mg by mouth daily.    [provider]  potassium chloride (KLOR-CON) 10 MEQ tablet Take 10 mEq by mouth daily. 08/31/18   [provider]  sertraline (ZOLOFT) 50 MG tablet Take 50 mg by mouth daily.    [provider]  traZODone (DESYREL) 50 MG tablet Take 50 mg by mouth at bedtime. 11/09/18   [provider]    Allergies    Patient has no known allergies.  Review of Systems   Review of Systems  Gastrointestinal: Positive for vomiting.  All other systems reviewed and are negative.   Physical Exam Updated Vital Signs BP 106/78 (BP Location: Left Arm)   Pulse 76   Temp 98.5 F (36.9 C) (Axillary)   Resp 20   SpO2 100%  Physical Exam Vitals and nursing note reviewed.  Constitutional:      Comments: Demented   HENT:     Head: Normocephalic.     Nose: Nose normal.     Mouth/Throat:     Mouth: Mucous membranes are moist.  Eyes:     Extraocular Movements: Extraocular movements intact.     Pupils: Pupils are equal, round, and reactive to light.  Cardiovascular:     Rate and Rhythm: Normal rate and regular rhythm.     Pulses: Normal pulses.     Heart sounds: Normal heart sounds.  Pulmonary:     Effort: Pulmonary effort is normal.     Breath sounds: Normal breath sounds.  Abdominal:     General: Abdomen is flat.     Palpations: Abdomen is soft.  Musculoskeletal:        General: Normal range of motion.     Cervical back: Normal range of motion and neck supple.   Skin:    General: Skin is warm.     Capillary Refill: Capillary refill takes less than 2 seconds.  Neurological:     General: No focal deficit present.     Comments: Demented, moving all extremities   Psychiatric:     Comments: Unable      ED Results / Procedures / Treatments   Labs (all labs ordered are listed, but only abnormal results are displayed) Labs Reviewed  CBC WITH DIFFERENTIAL/PLATELET - Abnormal; Notable for the following components:      Result Value   RBC 3.72 (*)    MCV 106.5 (*)    Lymphs Abs 0.4 (*)    All other components within normal limits  COMPREHENSIVE METABOLIC PANEL - Abnormal; Notable for the following components:   CO2 21 (*)    Glucose, Bld 110 (*)    Calcium 8.5 (*)    All other components within normal limits  URINALYSIS, ROUTINE W REFLEX MICROSCOPIC - Abnormal; Notable for the following components:   Hgb urine dipstick SMALL (*)    Leukocytes,Ua TRACE (*)    Bacteria, UA RARE (*)    All other components within normal limits  URINE CULTURE  LIPASE, BLOOD    EKG None  Radiology DG Chest Port 1 View  Result Date: 04/25/2020 CLINICAL DATA:  Nausea, vomiting, diarrhea for 2 days EXAM: PORTABLE CHEST 1 VIEW COMPARISON:  05/09/2019 FINDINGS: Single frontal view of the chest demonstrates an unremarkable cardiac silhouette. No acute airspace disease, effusion, or pneumothorax. Bibasilar scarring. No acute bony abnormality. IMPRESSION: 1. No acute intrathoracic process. Electronically Signed   By: Sharlet Salina M.D.   On: 04/25/2020 16:14   DG Abd Portable 1 View  Result Date: 04/25/2020 CLINICAL DATA:  Nausea, vomiting, diarrhea for 2 days EXAM: PORTABLE ABDOMEN - 1 VIEW COMPARISON:  None. FINDINGS: Supine frontal view of the abdomen and pelvis demonstrates an unremarkable bowel gas pattern. Moderate stool within the rectal vault. No masses or abnormal calcifications. Degenerative changes lumbosacral junction. Calcification left hemipelvis may  be vascular or related to degenerating uterine leiomyoma. IMPRESSION: 1. Unremarkable bowel gas pattern. Electronically Signed   By: Sharlet Salina M.D.   On: 04/25/2020 16:15    Procedures Procedures (including critical care time)  Medications Ordered in ED Medications  ondansetron (ZOFRAN-ODT) disintegrating tablet 4 mg (4 mg Oral Given 04/25/20 1419)    ED Course  I have reviewed the triage vital signs and the nursing notes.  Pertinent labs & imaging results that were available during  my care of the patient were reviewed by me and considered in my medical decision making (see chart for details).    MDM Rules/Calculators/A&P                         Alichia L Braggs is a 69 y.o. female here with vomiting.  Patient had 2 episode of vomiting at the nursing home.  Patient demented unable to give much history.  Abdomen is nontender.  Consider viral gastroenteritis.  Will get CBC and CMP and lipase.  Low suspicion for SBO and will get abdominal x-ray and if negative will not need a CT. Will give zofran and PO trial   4:36 PM Labs and x-rays are unremarkable.  Able to tolerate applesauce after Zofran.  Likely viral gastroenteritis.  Stable for discharge back to facility.  Final Clinical Impression(s) / ED Diagnoses Final diagnoses:  None    Rx / DC Orders ED Discharge Orders    None       Charlynne Pander, MD 04/25/20 [phone]

## 2020-04-25 NOTE — Discharge Instructions (Signed)
Your x-rays and lab work and urinalysis were unremarkable.   Take Zofran every 4 hours as needed for vomiting  Stay hydrated.  See your doctor  Return to ER if you have more vomiting, severe abdominal pain, fever

## 2020-04-25 NOTE — ED Notes (Signed)
Unable to obtain vitals at this time, pt wont keep equipment on.

## 2020-04-25 NOTE — ED Triage Notes (Signed)
Pt arrived via EMS from memory care facility for n/v/d for 2 days. Pt oriented to self only

## 2020-04-27 LAB — URINE CULTURE: Culture: NO GROWTH

## 2020-08-14 ENCOUNTER — Emergency Department (HOSPITAL_COMMUNITY)
Admission: EM | Admit: 2020-08-14 | Discharge: 2020-08-15 | Disposition: A | Discharge status: Home / Self Care | Payer: Medicare Other | Attending: Emergency Medicine | Admitting: Emergency Medicine

## 2020-08-14 ENCOUNTER — Emergency Department (HOSPITAL_COMMUNITY): Payer: Medicare Other

## 2020-08-14 ENCOUNTER — Encounter (HOSPITAL_COMMUNITY): Payer: Self-pay

## 2020-08-14 ENCOUNTER — Other Ambulatory Visit: Payer: Self-pay

## 2020-08-14 DIAGNOSIS — F17210 Nicotine dependence, cigarettes, uncomplicated: Secondary | ICD-10-CM | POA: Insufficient documentation

## 2020-08-14 DIAGNOSIS — Z8616 Personal history of COVID-19: Secondary | ICD-10-CM | POA: Insufficient documentation

## 2020-08-14 DIAGNOSIS — S0083XA Contusion of other part of head, initial encounter: Secondary | ICD-10-CM | POA: Diagnosis not present

## 2020-08-14 DIAGNOSIS — N390 Urinary tract infection, site not specified: Secondary | ICD-10-CM | POA: Diagnosis not present

## 2020-08-14 DIAGNOSIS — Z79899 Other long term (current) drug therapy: Secondary | ICD-10-CM | POA: Diagnosis not present

## 2020-08-14 DIAGNOSIS — Z7982 Long term (current) use of aspirin: Secondary | ICD-10-CM | POA: Diagnosis not present

## 2020-08-14 DIAGNOSIS — F0390 Unspecified dementia without behavioral disturbance: Secondary | ICD-10-CM | POA: Diagnosis not present

## 2020-08-14 DIAGNOSIS — W19XXXA Unspecified fall, initial encounter: Secondary | ICD-10-CM | POA: Insufficient documentation

## 2020-08-14 DIAGNOSIS — S0990XA Unspecified injury of head, initial encounter: Secondary | ICD-10-CM | POA: Diagnosis present

## 2020-08-14 DIAGNOSIS — Y92009 Unspecified place in unspecified non-institutional (private) residence as the place of occurrence of the external cause: Secondary | ICD-10-CM | POA: Diagnosis not present

## 2020-08-14 LAB — URINALYSIS, ROUTINE W REFLEX MICROSCOPIC
Bilirubin Urine: NEGATIVE
Glucose, UA: NEGATIVE mg/dL
Ketones, ur: NEGATIVE mg/dL
Leukocytes,Ua: NEGATIVE
Nitrite: POSITIVE — AB
Protein, ur: NEGATIVE mg/dL
Specific Gravity, Urine: 1.016 (ref 1.005–1.030)
pH: 5 (ref 5.0–8.0)

## 2020-08-14 MED ORDER — CEPHALEXIN 250 MG PO CAPS: 250.0000 mg | ORAL_CAPSULE | Freq: Four times a day (QID) | ORAL | 0 refills | Status: DC

## 2020-08-14 MED ORDER — CEPHALEXIN 500 MG PO CAPS
500.0000 mg | ORAL_CAPSULE | Freq: Once | ORAL | Status: AC
  Administered 2020-08-14: 500 mg via ORAL
  Filled 2020-08-14: qty 1

## 2020-08-14 NOTE — Discharge Instructions (Addendum)
Use ice on the sore area of her forehead 2 or 3 times a day to help the swelling.  She may develop a black eye.  Start the prescription antibiotic, cephalexin for urinary tract infection, tomorrow morning.  Make sure she gets plenty of rest and drinks a lot of fluids.  Try to assist her when she gets up to walk.

## 2020-08-14 NOTE — ED Triage Notes (Signed)
Coming from [facility], fall, hematoma and abrasion to left forehead, placed in c collar, history of dementia, knows self, no blood thinners

## 2020-08-14 NOTE — ED Notes (Signed)
RN attempted to give report to the facility. After following directions from the prompts, the call drops.

## 2020-08-14 NOTE — ED Provider Notes (Signed)
[facility]-EMERGENCY DEPT Provider Note   CSN: [id number] Arrival date & time: 08/14/20  1328     History Chief Complaint  Patient presents with  . Fall  . Abrasion    Christina Buck is a 70 y.o. female.  HPI She is here for evaluation of fall evidenced by injury to her forehead.  She comes from a nursing care facility without indicators for what happened.  She was transferred by EMS.  Level 5 caveat-dementia    Past Medical History:  Diagnosis Date  . Anxiety   . Arthritis   . Dementia [facility])     Patient Active Problem List   Diagnosis Date Noted  . COVID-19 virus infection 04/26/2019  . Acute respiratory failure with hypoxia (HCC) 04/26/2019  . Protein-calorie malnutrition, severe (HCC) 04/26/2019  . Hypokalemia 04/26/2019    History reviewed. No pertinent surgical history.   OB History   No obstetric history on file.     No family history on file.  Social History   Tobacco Use  . Smoking status: Current Every Day Smoker    Packs/day: 0.50    Years: 25.00    Pack years: 12.50    Types: Cigarettes  . Smokeless tobacco: Never Used  Substance Use Topics  . Alcohol use: Yes    Comment: occ  . Drug use: No    Home Medications Prior to Admission medications   Medication Sig Start Date End Date Taking? Authorizing Provider  cephALEXin (KEFLEX) 250 MG capsule Take 1 capsule (250 mg total) by mouth 4 (four) times daily. 08/14/20  Yes Mancel Bale, MD  acetaminophen (TYLENOL) 500 MG tablet Take 500 mg by mouth every 6 (six) hours as needed for pain. 02/23/18   [provider]  Amino Acids-Protein Hydrolys (FEEDING SUPPLEMENT, PRO-STAT SUGAR FREE 64,) LIQD Take 30 mLs by mouth 2 (two) times daily. 05/01/19   Lorin Glass, MD  aspirin 325 MG tablet Take 325 mg by mouth daily.    [provider]  feeding supplement, ENSURE ENLIVE, (ENSURE ENLIVE) LIQD Take 237 mLs by mouth 2 (two) times daily between meals. 05/01/19    Lorin Glass, MD  ferrous sulfate 325 (65 FE) MG EC tablet Take 325 mg by mouth daily before breakfast. 11/29/17   [provider]  furosemide (LASIX) 20 MG tablet Take 20 mg by mouth daily. 08/31/18   [provider]  hydrOXYzine (ATARAX/VISTARIL) 10 MG tablet Take 10 mg by mouth every 8 (eight) hours as needed for anxiety.    [provider]  Melatonin 3 MG TABS Take 3 mg by mouth at bedtime. 09/06/18   [provider]  omeprazole (PRILOSEC) 20 MG capsule Take 20 mg by mouth daily.    [provider]  ondansetron (ZOFRAN ODT) 4 MG disintegrating tablet 4mg  ODT q4 hours prn nausea/vomit 04/25/20   04/27/20, MD  potassium chloride (KLOR-CON) 10 MEQ tablet Take 10 mEq by mouth daily. 08/31/18   [provider]  sertraline (ZOLOFT) 50 MG tablet Take 50 mg by mouth daily.    [provider]  traZODone (DESYREL) 50 MG tablet Take 50 mg by mouth at bedtime. 11/09/18   [provider]    Allergies    Patient has no known allergies.  Review of Systems   Review of Systems  Unable to perform ROS: Dementia    Physical Exam Updated Vital Signs BP 109/64   Pulse 79   Temp 98.4 F (36.9 C) (Oral)  Resp 12   SpO2 99%   Physical Exam Vitals and nursing note reviewed.  Constitutional:      General: She is not in acute distress.    Appearance: She is well-developed. She is not ill-appearing, toxic-appearing or diaphoretic.  HENT:     Head: Normocephalic.     Comments: Large contusion left forehead without abrasion or laceration.  No other injury about the face or cranium.    Right Ear: External ear normal.     Left Ear: External ear normal.  Eyes:     Conjunctiva/sclera: Conjunctivae normal.     Pupils: Pupils are equal, round, and reactive to light.  Neck:     Trachea: Phonation normal.  Cardiovascular:     Rate and Rhythm: Normal rate.  Pulmonary:     Effort: Pulmonary effort is normal.  Chest:     Chest  wall: No tenderness.  Abdominal:     General: There is no distension.     Tenderness: There is no abdominal tenderness.  Musculoskeletal:        General: No swelling or tenderness.     Cervical back: Normal range of motion and neck supple.     Comments: No large joint deformity or pain with passive range of motion.  Skin:    General: Skin is warm and dry.     Coloration: Skin is not jaundiced or pale.  Neurological:     Mental Status: She is disoriented.     Cranial Nerves: No cranial nerve deficit.     Motor: No abnormal muscle tone.     ED Results / Procedures / Treatments   Labs (all labs ordered are listed, but only abnormal results are displayed) Labs Reviewed  URINALYSIS, ROUTINE W REFLEX MICROSCOPIC - Abnormal; Notable for the following components:      Result Value   APPearance HAZY (*)    Hgb urine dipstick SMALL (*)    Nitrite POSITIVE (*)    Bacteria, UA MANY (*)    All other components within normal limits  URINE CULTURE    EKG None  Radiology CT Head Wo Contrast  Result Date: 08/14/2020 CLINICAL DATA:  Fall, hematoma and abrasion to left forehead, dementia EXAM: CT HEAD WITHOUT CONTRAST CT CERVICAL SPINE WITHOUT CONTRAST TECHNIQUE: Multidetector CT imaging of the head and cervical spine was performed following the standard protocol without intravenous contrast. Multiplanar CT image reconstructions of the cervical spine were also generated. COMPARISON:  06/05/2019 FINDINGS: CT HEAD FINDINGS Brain: No evidence of acute infarction, hemorrhage, hydrocephalus, extra-axial collection or mass lesion/mass effect. Periventricular and deep white matter hypodensity. Encephalomalacia of the right temporal pole. Moderate global cerebral volume loss. Vascular: No hyperdense vessel or unexpected calcification. Skull: Normal. Negative for fracture or focal lesion. Sinuses/Orbits: No acute finding. Other: Soft tissue contusion of the left forehead (series 3, image 17). CT CERVICAL  SPINE FINDINGS Alignment: Normal. Skull base and vertebrae: No acute fracture. No primary bone lesion or focal pathologic process. Soft tissues and spinal canal: No prevertebral fluid or swelling. No visible canal hematoma. Disc levels: Mild to moderate disc space height loss and osteophytosis of the lower cervical levels, worst at C6-C7. Upper chest: Negative. Other: None. IMPRESSION: 1. No acute intracranial pathology. 2. 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 3. 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 4. Soft tissue contusion of the left forehead. 5. No fracture or static subluxation of the cervical spine. Electronically Signed   By:  Lauralyn Primes M.D.   On: 08/14/2020 14:53   CT Cervical Spine Wo Contrast  Result Date: 08/14/2020 CLINICAL DATA:  Fall, hematoma and abrasion to left forehead, dementia EXAM: CT HEAD WITHOUT CONTRAST CT CERVICAL SPINE WITHOUT CONTRAST TECHNIQUE: Multidetector CT imaging of the head and cervical spine was performed following the standard protocol without intravenous contrast. Multiplanar CT image reconstructions of the cervical spine were also generated. COMPARISON:  06/05/2019 FINDINGS: CT HEAD FINDINGS Brain: No evidence of acute infarction, hemorrhage, hydrocephalus, extra-axial collection or mass lesion/mass effect. Periventricular and deep white matter hypodensity. Encephalomalacia of the right temporal pole. Moderate global cerebral volume loss. Vascular: No hyperdense vessel or unexpected calcification. Skull: Normal. Negative for fracture or focal lesion. Sinuses/Orbits: No acute finding. Other: Soft tissue contusion of the left forehead (series 3, image 17). CT CERVICAL SPINE FINDINGS Alignment: Normal. Skull base and vertebrae: No acute fracture. No primary bone lesion or focal pathologic process. Soft tissues and spinal canal: No prevertebral fluid or swelling. No visible canal hematoma. Disc levels: Mild  to moderate disc space height loss and osteophytosis of the lower cervical levels, worst at C6-C7. Upper chest: Negative. Other: None. IMPRESSION: 1. No acute intracranial pathology. 2. 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 3. 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 4. Soft tissue contusion of the left forehead. 5. No fracture or static subluxation of the cervical spine. Electronically Signed   By: Lauralyn Primes M.D.   On: 08/14/2020 14:53    Procedures Procedures   Medications Ordered in ED Medications  cephALEXin (KEFLEX) capsule 500 mg (500 mg Oral Given 08/14/20 1810)    ED Course  I have reviewed the triage vital signs and the nursing notes.  Pertinent labs & imaging results that were available during my care of the patient were reviewed by me and considered in my medical decision making (see chart for details).    MDM Rules/Calculators/A&P                           Patient Vitals for the past 24 hrs:  BP Temp Temp src Pulse Resp SpO2  08/14/20 1900 109/64 -- -- 79 12 99 %  08/14/20 1830 120/65 -- -- 78 13 100 %  08/14/20 1800 114/66 -- -- 80 17 99 %  08/14/20 1730 (!) 117/57 -- -- 79 (!) 26 97 %  08/14/20 1700 140/71 -- -- 82 (!) 28 100 %  08/14/20 1645 -- -- -- 83 20 97 %  08/14/20 1630 128/78 -- -- 80 14 99 %  08/14/20 1615 -- -- -- 80 13 99 %  08/14/20 1600 112/71 -- -- 81 17 99 %  08/14/20 1545 -- -- -- 81 13 99 %  08/14/20 1530 93/61 -- -- 83 14 98 %  08/14/20 1515 -- -- -- 82 (!) 23 99 %  08/14/20 1500 (!) 100/54 -- -- 81 14 100 %  08/14/20 1445 -- -- -- 81 (!) 23 100 %  08/14/20 1415 -- -- -- 81 20 96 %  08/14/20 1401 (!) 117/96 -- -- 61 (!) 22 94 %  08/14/20 1400 -- -- -- 70 (!) 34 --  08/14/20 1359 (!) 118/106 -- -- -- (!) 27 --  08/14/20 1358 -- -- -- 94 (!) 28 90 %  08/14/20 1354 (!) 125/104 98.4 F (36.9 C) Oral 83 16 95 %  08/14/20 1342 -- -- -- -- -- 98 %  7:31 PM Reevaluation with  update and discussion. After initial assessment and treatment, an updated evaluation reveals no change in clinical status.  Findings and plan discussed with patient son. Mancel Bale   Medical Decision Making:  This patient is presenting for evaluation of injury from fall, which does require a range of treatment options, and is a complaint that involves a moderate risk of morbidity and mortality. The differential diagnoses include head injury, cervical injury, acute illness. I decided to review old records, and in summary elderly female, at her assisted living facility when she fell.  This was apparently not unwitnessed fall.  She presents with injury to forehead.  I obtained additional historical information from patient's son, by telephone, at 7:28 PM.  Clinical Laboratory Tests Ordered, included Urinalysis. Review indicates likely urinary tract infection, culture sent. Radiologic Tests Ordered, included CT head and cervical spine.  I independently Visualized: Radiographic images, which show no acute injuries    Critical Interventions-clinical evaluation, laboratory testing, radiography, observation reassessment  After These Interventions, the Patient was reevaluated and was found with isolated injury to her forehead.  No evidence for intracranial injury or cervical spine injury.  Screening labs, urinalysis, indicative of UTI.  Patient started on Keflex in the ED.  Prescription sent with her at discharge  CRITICAL CARE-no Performed by: Mancel Bale  Nursing Notes Reviewed/ Care Coordinated Applicable Imaging Reviewed Interpretation of Laboratory Data incorporated into ED treatment  The patient appears reasonably screened and/or stabilized for discharge and I doubt any other medical condition or other [facility] requiring further screening, evaluation, or treatment in the ED at this time prior to discharge.  Plan: Home Medications-continue; Home Treatments-symptomatic care with cryotherapy;  return here if the recommended treatment, does not improve the symptoms; Recommended follow up-PCP checkup 1 week appear     Final Clinical Impression(s) / ED Diagnoses Final diagnoses:  Injury of head, initial encounter  Fall in home, initial encounter  Urinary tract infection without hematuria, site unspecified    Rx / DC Orders ED Discharge Orders         Ordered    cephALEXin (KEFLEX) 250 MG capsule  4 times daily        08/14/20 1930           Mancel Bale, MD 08/14/20 1936

## 2020-08-17 LAB — URINE CULTURE: Culture: >=100000 — AB

## 2020-08-18 ENCOUNTER — Telehealth: Payer: Self-pay | Admitting: Emergency Medicine

## 2020-08-18 NOTE — Telephone Encounter (Signed)
Post ED Visit - Positive Culture Follow-up  Culture report reviewed by antimicrobial stewardship pharmacist: [facility] []  [facility], Pharm.D. []  [facility], Pharm.D., BCPS AQ-ID []  , Pharm.D., BCPS []  Celedonio Miyamoto, Pharm.D., BCPS []  Collins, Garvin Fila.D., BCPS, AAHIVP []  , Pharm.D., BCPS, AAHIVP []  Georgina Pillion, PharmD, BCPS []  , PharmD, BCPS []  [place], PharmD, BCPS []  [address], PharmD []  , PharmD, BCPS []  Estella Husk, PharmD  Pharmacy Team []  Lysle Pearl, PharmD []  , PharmD []  Phillips Climes, PharmD []  , Rph []  Agapito Games) , PharmD []  Verlan Friends, PharmD []  , PharmD []  Mervyn Gay, PharmD []  , PharmD []  Vinnie Level, PharmD []  [facility], PharmD []  , PharmD []  Len Childs, PharmD   Positive urine culture Treated with cephalexin, organism sensitive to the same and no further patient follow-up is required at this time.  08/18/2020, 11:27 AM

## 2020-08-19 ENCOUNTER — Emergency Department (HOSPITAL_COMMUNITY): Payer: Medicare Other

## 2020-08-19 ENCOUNTER — Inpatient Hospital Stay (HOSPITAL_COMMUNITY)
Admission: EM | Admit: 2020-08-19 | Discharge: 2020-08-26 | DRG: 682 | Disposition: A | Discharge status: Skilled Nursing Facility | Payer: Medicare Other | Attending: Internal Medicine | Admitting: Internal Medicine

## 2020-08-19 ENCOUNTER — Encounter (HOSPITAL_COMMUNITY): Payer: Self-pay

## 2020-08-19 DIAGNOSIS — E860 Dehydration: Secondary | ICD-10-CM | POA: Diagnosis present

## 2020-08-19 DIAGNOSIS — Z515 Encounter for palliative care: Secondary | ICD-10-CM | POA: Diagnosis not present

## 2020-08-19 DIAGNOSIS — F17210 Nicotine dependence, cigarettes, uncomplicated: Secondary | ICD-10-CM | POA: Diagnosis present

## 2020-08-19 DIAGNOSIS — F419 Anxiety disorder, unspecified: Secondary | ICD-10-CM | POA: Diagnosis present

## 2020-08-19 DIAGNOSIS — Z7982 Long term (current) use of aspirin: Secondary | ICD-10-CM | POA: Diagnosis not present

## 2020-08-19 DIAGNOSIS — B9620 Unspecified Escherichia coli [E. coli] as the cause of diseases classified elsewhere: Secondary | ICD-10-CM | POA: Diagnosis present

## 2020-08-19 DIAGNOSIS — N179 Acute kidney failure, unspecified: Secondary | ICD-10-CM | POA: Diagnosis present

## 2020-08-19 DIAGNOSIS — N390 Urinary tract infection, site not specified: Secondary | ICD-10-CM | POA: Diagnosis present

## 2020-08-19 DIAGNOSIS — R64 Cachexia: Secondary | ICD-10-CM | POA: Diagnosis present

## 2020-08-19 DIAGNOSIS — Z66 Do not resuscitate: Secondary | ICD-10-CM | POA: Diagnosis not present

## 2020-08-19 DIAGNOSIS — R451 Restlessness and agitation: Secondary | ICD-10-CM | POA: Diagnosis present

## 2020-08-19 DIAGNOSIS — E878 Other disorders of electrolyte and fluid balance, not elsewhere classified: Secondary | ICD-10-CM | POA: Diagnosis present

## 2020-08-19 DIAGNOSIS — R195 Other fecal abnormalities: Secondary | ICD-10-CM | POA: Diagnosis present

## 2020-08-19 DIAGNOSIS — Z79899 Other long term (current) drug therapy: Secondary | ICD-10-CM | POA: Diagnosis not present

## 2020-08-19 DIAGNOSIS — R1310 Dysphagia, unspecified: Secondary | ICD-10-CM | POA: Diagnosis present

## 2020-08-19 DIAGNOSIS — F0390 Unspecified dementia without behavioral disturbance: Secondary | ICD-10-CM | POA: Diagnosis present

## 2020-08-19 DIAGNOSIS — Z7189 Other specified counseling: Secondary | ICD-10-CM | POA: Diagnosis not present

## 2020-08-19 DIAGNOSIS — K219 Gastro-esophageal reflux disease without esophagitis: Secondary | ICD-10-CM | POA: Diagnosis present

## 2020-08-19 DIAGNOSIS — E876 Hypokalemia: Secondary | ICD-10-CM | POA: Diagnosis present

## 2020-08-19 DIAGNOSIS — Z20822 Contact with and (suspected) exposure to covid-19: Secondary | ICD-10-CM | POA: Diagnosis present

## 2020-08-19 DIAGNOSIS — F0391 Unspecified dementia with behavioral disturbance: Secondary | ICD-10-CM | POA: Diagnosis not present

## 2020-08-19 DIAGNOSIS — Z681 Body mass index (BMI) 19 or less, adult: Secondary | ICD-10-CM | POA: Diagnosis not present

## 2020-08-19 DIAGNOSIS — E43 Unspecified severe protein-calorie malnutrition: Secondary | ICD-10-CM | POA: Diagnosis present

## 2020-08-19 DIAGNOSIS — E870 Hyperosmolality and hypernatremia: Secondary | ICD-10-CM | POA: Diagnosis present

## 2020-08-19 DIAGNOSIS — E861 Hypovolemia: Secondary | ICD-10-CM | POA: Diagnosis present

## 2020-08-19 DIAGNOSIS — R339 Retention of urine, unspecified: Secondary | ICD-10-CM | POA: Diagnosis not present

## 2020-08-19 DIAGNOSIS — R4182 Altered mental status, unspecified: Secondary | ICD-10-CM

## 2020-08-19 DIAGNOSIS — Z8616 Personal history of COVID-19: Secondary | ICD-10-CM | POA: Diagnosis not present

## 2020-08-19 LAB — BASIC METABOLIC PANEL
Anion gap: 13 (ref 5–15)
Anion gap: 8 (ref 5–15)
BUN: 58 mg/dL — ABNORMAL HIGH (ref 8–23)
BUN: 44 mg/dL — ABNORMAL HIGH (ref 8–23)
CO2: 28 mmol/L (ref 22–32)
CO2: 27 mmol/L (ref 22–32)
Calcium: 9.3 mg/dL (ref 8.9–10.3)
Calcium: 8.4 mg/dL — ABNORMAL LOW (ref 8.9–10.3)
Chloride: 123 mmol/L — ABNORMAL HIGH (ref 98–111)
Chloride: 120 mmol/L — ABNORMAL HIGH (ref 98–111)
Creatinine, Ser: 1.57 mg/dL — ABNORMAL HIGH (ref 0.44–1.00)
Creatinine, Ser: 1.18 mg/dL — ABNORMAL HIGH (ref 0.44–1.00)
GFR, Estimated: 35 mL/min — ABNORMAL LOW (ref >60)
GFR, Estimated: 49 mL/min — ABNORMAL LOW (ref >60)
Glucose, Bld: 97 mg/dL (ref 70–99)
Glucose, Bld: 104 mg/dL — ABNORMAL HIGH (ref 70–99)
Potassium: 4.4 mmol/L (ref 3.5–5.1)
Potassium: 3.2 mmol/L — ABNORMAL LOW (ref 3.5–5.1)
Sodium: 164 mmol/L (ref 135–145)
Sodium: 155 mmol/L — ABNORMAL HIGH (ref 135–145)

## 2020-08-19 LAB — TSH: TSH: 1.598 u[IU]/mL (ref 0.350–4.500)

## 2020-08-19 LAB — CBC WITH DIFFERENTIAL/PLATELET
Abs Immature Granulocytes: 0 10*3/uL (ref 0.00–0.07)
Basophils Absolute: 0 10*3/uL (ref 0.0–0.1)
Basophils Relative: 0 %
Eosinophils Absolute: 0 10*3/uL (ref 0.0–0.5)
Eosinophils Relative: 0 %
HCT: 43.5 % (ref 36.0–46.0)
Hemoglobin: 13.1 g/dL (ref 12.0–15.0)
Lymphocytes Relative: 45 %
Lymphs Abs: 2.4 10*3/uL (ref 0.7–4.0)
MCH: 33.2 pg (ref 26.0–34.0)
MCHC: 30.1 g/dL (ref 30.0–36.0)
MCV: 110.1 fL — ABNORMAL HIGH (ref 80.0–100.0)
Monocytes Absolute: 0.3 10*3/uL (ref 0.1–1.0)
Monocytes Relative: 6 %
Neutro Abs: 2.6 10*3/uL (ref 1.7–7.7)
Neutrophils Relative %: 49 %
Platelets: 259 10*3/uL (ref 150–400)
RBC: 3.95 MIL/uL (ref 3.87–5.11)
RDW: 13.3 % (ref 11.5–15.5)
WBC: 5.4 10*3/uL (ref 4.0–10.5)
nRBC: 0 % (ref 0.0–0.2)
nRBC: 0 /100 WBC

## 2020-08-19 LAB — CBC
HCT: 40.1 % (ref 36.0–46.0)
Hemoglobin: 12.3 g/dL (ref 12.0–15.0)
MCH: 33.3 pg (ref 26.0–34.0)
MCHC: 30.7 g/dL (ref 30.0–36.0)
MCV: 108.7 fL — ABNORMAL HIGH (ref 80.0–100.0)
Platelets: 233 10*3/uL (ref 150–400)
RBC: 3.69 MIL/uL — ABNORMAL LOW (ref 3.87–5.11)
RDW: 13.4 % (ref 11.5–15.5)
WBC: 5.9 10*3/uL (ref 4.0–10.5)
nRBC: 0.3 % — ABNORMAL HIGH (ref 0.0–0.2)

## 2020-08-19 LAB — BRAIN NATRIURETIC PEPTIDE: B Natriuretic Peptide: 43.1 pg/mL (ref 0.0–100.0)

## 2020-08-19 LAB — MAGNESIUM: Magnesium: 3 mg/dL — ABNORMAL HIGH (ref 1.7–2.4)

## 2020-08-19 LAB — RESP PANEL BY RT-PCR (FLU A&B, COVID) ARPGX2
Influenza A by PCR: NEGATIVE
Influenza B by PCR: NEGATIVE
SARS Coronavirus 2 by RT PCR: NEGATIVE

## 2020-08-19 LAB — TROPONIN I (HIGH SENSITIVITY)
Troponin I (High Sensitivity): 17 ng/L (ref <18)
Troponin I (High Sensitivity): 18 ng/L — ABNORMAL HIGH (ref <18)

## 2020-08-19 LAB — LACTIC ACID, PLASMA: Lactic Acid, Venous: 1.3 mmol/L (ref 0.5–1.9)

## 2020-08-19 MED ORDER — PANTOPRAZOLE SODIUM 40 MG PO TBEC: 40.0000 mg | DELAYED_RELEASE_TABLET | Freq: Every day | ORAL | Status: DC

## 2020-08-19 MED ORDER — POTASSIUM CHLORIDE CRYS ER 10 MEQ PO TBCR: 10.0000 meq | EXTENDED_RELEASE_TABLET | Freq: Every day | ORAL | Status: DC

## 2020-08-19 MED ORDER — ACETAMINOPHEN 650 MG RE SUPP: 650.0000 mg | Freq: Four times a day (QID) | RECTAL | Status: DC | PRN

## 2020-08-19 MED ORDER — ENOXAPARIN SODIUM 40 MG/0.4ML ~~LOC~~ SOLN
40.0000 mg | SUBCUTANEOUS | Status: DC
  Administered 2020-08-19 – 2020-08-25 (×7): 40 mg via SUBCUTANEOUS
  Filled 2020-08-19 (×7): qty 0.4

## 2020-08-19 MED ORDER — DEXTROSE 5 % IV SOLN: INTRAVENOUS | Status: DC

## 2020-08-19 MED ORDER — SERTRALINE HCL 50 MG PO TABS: 50.0000 mg | ORAL_TABLET | Freq: Every day | ORAL | Status: DC

## 2020-08-19 MED ORDER — ASPIRIN 325 MG PO TABS: 325.0000 mg | ORAL_TABLET | Freq: Every day | ORAL | Status: DC

## 2020-08-19 MED ORDER — TRAZODONE HCL 50 MG PO TABS: 50.0000 mg | ORAL_TABLET | Freq: Every day | ORAL | Status: DC

## 2020-08-19 MED ORDER — FERROUS SULFATE 325 (65 FE) MG PO TABS: 325.0000 mg | ORAL_TABLET | Freq: Every day | ORAL | Status: DC

## 2020-08-19 MED ORDER — ACETAMINOPHEN 325 MG PO TABS
650.0000 mg | ORAL_TABLET | Freq: Four times a day (QID) | ORAL | Status: DC | PRN
  Administered 2020-08-21 – 2020-08-22 (×3): 650 mg via ORAL
  Filled 2020-08-19 (×3): qty 2

## 2020-08-19 MED ORDER — HYDROXYZINE HCL 10 MG PO TABS
10.0000 mg | ORAL_TABLET | Freq: Three times a day (TID) | ORAL | Status: DC | PRN
  Filled 2020-08-19: qty 1

## 2020-08-19 MED ORDER — LACTATED RINGERS IV BOLUS
500.0000 mL | Freq: Once | INTRAVENOUS | Status: AC
  Administered 2020-08-19: 500 mL via INTRAVENOUS

## 2020-08-19 MED ORDER — MELATONIN 3 MG PO TABS
3.0000 mg | ORAL_TABLET | Freq: Every day | ORAL | Status: DC
  Filled 2020-08-19: qty 1

## 2020-08-19 NOTE — H&P (Addendum)
Date: 08/19/2020               Patient Name:  Christina Buck MRN: [id number]  DOB: 07/18/1950 Age / Sex: 70 y.o., female   PCP: Selina CooleyWitten, Bobby, MD         Medical Service: Internal Medicine Teaching Service         Attending Physician: Dr. Inez CatalinaMullen, Emily B, MD    First Contact: Nena PolioSusan Marquie Aderhold, MS4 Pager: [phone]   Second Contact: Elige Radonylee Christian, MD Pager: Sueanne MargaritaC [phone]       After Hours (After 5p/  First Contact Pager: [phone]  weekends / holidays): Second Contact Pager: [phone]   SUBJECTIVE   Chief Complaint: Fall   History of Present Illness: Patient with severe dementia and is unable to provide history. History is provided by chart review and from son. Patient was [facility] at [facility]/14/2020 and was found to have UTI w/ pan-sensitve E coli. Patient treated with Keflex and discharged back to facility. Today patient slid out of her chair and had possible fall, unsure of head trauma.   Per report by son, she has had progressive decline in mental status over the last year. She is unable to hold conversation at baseline. There is some concern for recent worsening of mentation. Son states she had been able to feed herself and drink but is unsure of how much she is getting at the facility. He says she may be so confused that it is hard for her to ask for food/drink.   ED Course: Patient was worked up for potential trauma. Head to toe exam unrevealing, CXR was unremarkable and pelvic Xray with no fracture. CT head and spine with no evidence of significant acute traumatic injury to skull, brain or cervical spine. Of note, severe cerebral and mild cerebellar atrophy with ex vacuo dilation of ventricular system and extensive chronic microvascular changes were reported. Patient found to have severe hypernatremia Na+ of 164, AKI and flat troponin's. Suggestive of severe dehydration. Patient given 500 mL bolus of LR in ED.   Lab Orders     Resp Panel by RT-PCR (Flu A&B, Covid) Nasopharyngeal Swab      CBC with Differential     Basic metabolic panel     Brain natriuretic peptide     Magnesium     Urinalysis, Routine w reflex microscopic     CBC     Creatinine, serum     Basic metabolic panel     TSH     Comprehensive metabolic panel     CBC   Meds:  Current Meds  Medication Sig  . acetaminophen (TYLENOL) 500 MG tablet Take 500 mg by mouth every 6 (six) hours as needed for pain.  . Amino Acids-Protein Hydrolys (FEEDING SUPPLEMENT, PRO-STAT SUGAR FREE 64,) LIQD Take 30 mLs by mouth 2 (two) times daily.  Marland Kitchen. aspirin 81 MG chewable tablet Chew 81 mg by mouth daily.  . cephALEXin (KEFLEX) 250 MG capsule Take 1 capsule (250 mg total) by mouth 4 (four) times daily.  . divalproex (DEPAKOTE) 125 MG DR tablet Take 125 mg by mouth at bedtime.  . feeding supplement, ENSURE ENLIVE, (ENSURE ENLIVE) LIQD Take 237 mLs by mouth 2 (two) times daily between meals.  . ferrous sulfate 325 (65 FE) MG EC tablet Take 325 mg by mouth every other day.  . furosemide (LASIX) 20 MG tablet Take 20 mg by mouth daily.  . hydrOXYzine (ATARAX/VISTARIL) 10 MG tablet Take 10 mg by mouth every  8 (eight) hours as needed for anxiety.  . Melatonin 3 MG TABS Take 3 mg by mouth at bedtime.  Marland Kitchen omeprazole (PRILOSEC) 20 MG capsule Take 20 mg by mouth daily.  . potassium chloride (KLOR-CON) 10 MEQ tablet Take 10 mEq by mouth daily.  . rosuvastatin (CRESTOR) 10 MG tablet Take 10 mg by mouth at bedtime.  . sertraline (ZOLOFT) 100 MG tablet Take 100 mg by mouth daily.  . traZODone (DESYREL) 50 MG tablet Take 50 mg by mouth at bedtime.  . vitamin B-12 (CYANOCOBALAMIN) 1000 MCG tablet Take 1,000 mcg by mouth daily.    Social:  Lives With: Nursing facility  Occupation: Retired  Support: Two sons  Level of Function: Severely diminished  PCP: Mercie Eon, MD  Substances: No current use   Family History: Unable to get at this time    Allergies: Allergies as of 08/19/2020  . (No Known Allergies)   Past Medical History:   Diagnosis Date  . Anxiety   . Arthritis   . Dementia (HCC)      Review of Systems: A complete ROS was negative except as per HPI.   OBJECTIVE:   Physical Exam: Blood pressure 91/68, pulse 83, temperature 97.6 F (36.4 C), temperature source Oral, resp. rate 17, SpO2 100 %. Physical Exam Constitutional:      General: She is not in acute distress.    Appearance: She is not ill-appearing, toxic-appearing or diaphoretic.     Comments: Cachetic   HENT:     Head: Normocephalic and atraumatic.     Nose: Nose normal. No congestion or rhinorrhea.     Mouth/Throat:     Mouth: Mucous membranes are dry.     Pharynx: Oropharynx is clear.  Eyes:     General:        Right eye: No discharge.        Left eye: No discharge.     Extraocular Movements: Extraocular movements intact.     Conjunctiva/sclera: Conjunctivae normal.  Cardiovascular:     Rate and Rhythm: Normal rate.     Pulses: Normal pulses.     Heart sounds: Normal heart sounds. No murmur heard. No friction rub. No gallop.   Pulmonary:     Effort: Pulmonary effort is normal.     Breath sounds: Normal breath sounds. No wheezing, rhonchi or rales.  Abdominal:     General: Abdomen is flat.     Palpations: Abdomen is soft. There is no mass.     Tenderness: There is abdominal tenderness. There is no guarding or rebound.  Musculoskeletal:        General: No swelling. Normal range of motion.     Cervical back: Normal range of motion.  Skin:    General: Skin is warm and dry.  Neurological:     Mental Status: She is disoriented.     Comments: Patient can visually track, would answer yes or no questions, could not state name, could not follow simple commands       Labs: CBC    Component Value Date/Time   WBC 5.4 08/19/2020 1007   RBC 3.95 08/19/2020 1007   HGB 13.1 08/19/2020 1007   HCT 43.5 08/19/2020 1007   PLT 259 08/19/2020 1007   MCV 110.1 (H) 08/19/2020 1007   MCH 33.2 08/19/2020 1007   MCHC 30.1 08/19/2020  1007   RDW 13.3 08/19/2020 1007   LYMPHSABS 2.4 08/19/2020 1007   MONOABS 0.3 08/19/2020 1007   EOSABS 0.0 08/19/2020 1007  BASOSABS 0.0 08/19/2020 1007     CMP     Component Value Date/Time   NA 164 (HH) 08/19/2020 1007   K 4.4 08/19/2020 1007   CL 123 (H) 08/19/2020 1007   CO2 28 08/19/2020 1007   GLUCOSE 97 08/19/2020 1007   BUN 58 (H) 08/19/2020 1007   CREATININE 1.57 (H) 08/19/2020 1007   CALCIUM 9.3 08/19/2020 1007   PROT 6.7 04/25/2020 1340   ALBUMIN 3.9 04/25/2020 1340   AST 24 04/25/2020 1340   ALT 18 04/25/2020 1340   ALKPHOS 87 04/25/2020 1340   BILITOT 0.6 04/25/2020 1340   GFRNONAA 35 (L) 08/19/2020 1007   GFRAA >60 05/09/2019 2036    Imaging: CT Head Wo Contrast  Result Date: 08/19/2020 CLINICAL DATA:  70 year old female with history of head trauma presenting with altered mental status. EXAM: CT HEAD WITHOUT CONTRAST CT CERVICAL SPINE WITHOUT CONTRAST TECHNIQUE: Multidetector CT imaging of the head and cervical spine was performed following the standard protocol without intravenous contrast. Multiplanar CT image reconstructions of the cervical spine were also generated. COMPARISON:  Head and cervical spine CT 08/14/2020. FINDINGS: CT HEAD FINDINGS Brain: Severe cerebral and mild cerebellar atrophy with ex vacuo dilatation of the ventricular system. Patchy and confluent areas of decreased attenuation are noted throughout the deep and periventricular white matter of the cerebral hemispheres bilaterally, compatible with chronic microvascular ischemic disease. No evidence of acute infarction, hemorrhage, hydrocephalus, extra-axial collection or mass lesion/mass effect. Calcifications of the left tentorium cerebelli, similar to the prior examinations. Vascular: No hyperdense vessel or unexpected calcification. Skull: Normal. Negative for fracture or focal lesion. Sinuses/Orbits: No acute finding. Other: None. CT CERVICAL SPINE FINDINGS Alignment: Normal. Skull base and  vertebrae: No acute fracture. No primary bone lesion or focal pathologic process. Soft tissues and spinal canal: No prevertebral fluid or swelling. No visible canal hematoma. Disc levels: Multilevel degenerative disc disease, most pronounced at C5-C6 and C6-C7. Mild multilevel facet arthropathy. Upper chest: Unremarkable. Other: None. IMPRESSION: 1. No evidence of significant acute traumatic injury to the skull, brain or cervical spine. 2. Severe cerebral and mild cerebellar atrophy with ex vacuo dilatation of the ventricular system, and extensive chronic microvascular ischemic changes in the cerebral white matter. 3. Multilevel degenerative disc disease and cervical spondylosis, as above. Electronically Signed   By: Trudie Reed M.D.   On: 08/19/2020 11:11   CT Cervical Spine Wo Contrast  Result Date: 08/19/2020 CLINICAL DATA:  70 year old female with history of head trauma presenting with altered mental status. EXAM: CT HEAD WITHOUT CONTRAST CT CERVICAL SPINE WITHOUT CONTRAST TECHNIQUE: Multidetector CT imaging of the head and cervical spine was performed following the standard protocol without intravenous contrast. Multiplanar CT image reconstructions of the cervical spine were also generated. COMPARISON:  Head and cervical spine CT 08/14/2020. FINDINGS: CT HEAD FINDINGS Brain: Severe cerebral and mild cerebellar atrophy with ex vacuo dilatation of the ventricular system. Patchy and confluent areas of decreased attenuation are noted throughout the deep and periventricular white matter of the cerebral hemispheres bilaterally, compatible with chronic microvascular ischemic disease. No evidence of acute infarction, hemorrhage, hydrocephalus, extra-axial collection or mass lesion/mass effect. Calcifications of the left tentorium cerebelli, similar to the prior examinations. Vascular: No hyperdense vessel or unexpected calcification. Skull: Normal. Negative for fracture or focal lesion. Sinuses/Orbits: No  acute finding. Other: None. CT CERVICAL SPINE FINDINGS Alignment: Normal. Skull base and vertebrae: No acute fracture. No primary bone lesion or focal pathologic process. Soft tissues and spinal canal: No prevertebral  fluid or swelling. No visible canal hematoma. Disc levels: Multilevel degenerative disc disease, most pronounced at C5-C6 and C6-C7. Mild multilevel facet arthropathy. Upper chest: Unremarkable. Other: None. IMPRESSION: 1. No evidence of significant acute traumatic injury to the skull, brain or cervical spine. 2. Severe cerebral and mild cerebellar atrophy with ex vacuo dilatation of the ventricular system, and extensive chronic microvascular ischemic changes in the cerebral white matter. 3. Multilevel degenerative disc disease and cervical spondylosis, as above. Electronically Signed   By: Trudie Reed M.D.   On: 08/19/2020 11:11   DG Pelvis Portable  Result Date: 08/19/2020 CLINICAL DATA:  Fall. EXAM: PORTABLE PELVIS 1-2 VIEWS COMPARISON:  None. FINDINGS: There is no evidence of pelvic fracture or diastasis. No pelvic bone lesions are seen. IMPRESSION: Negative. Electronically Signed   By: Obie Dredge M.D.   On: 08/19/2020 10:45   DG Chest Portable 1 View  Result Date: 08/19/2020 CLINICAL DATA:  Hypoxia. EXAM: PORTABLE CHEST 1 VIEW COMPARISON:  04/25/2020 FINDINGS: Assessment is limited by leftward patient rotation, patient kyphosis resulting in obscuration of the left lung apex by the patient's jaw, and overlying telemetry leads. The cardiac silhouette is normal in size. There is mild chronic prominence of the interstitial markings. No overt pulmonary edema, airspace consolidation, sizeable pleural effusion, or pneumothorax is identified within the above described limitations. No acute osseous abnormality is seen. IMPRESSION: Limited examination without evidence of an acute cardiopulmonary process. Electronically Signed   By: Sebastian Ache M.D.   On: 08/19/2020 10:46    EKG:  personally reviewed my interpretation is right bundle branch block, unchanged from previous     ASSESSMENT & PLAN:    Assessment & Plan by Problem: Active Problems:   Hypernatremia   Christina Buck is a 70 y.o. with pertinent PMH of dementia, severe protein calorie malnutrition, anxiety, GERD, arthritis, recent UTI who presented with fall and admit for severe hypernatremia, AKI on hospital day 0  Severe Hypernatremia Hyperchloremia   AKI Has hx of hypernatremia. Today Na+ of 164. Chloride of 123. Creatine of 1.57 and BUN of 58. All likely do to severe dehydration, supported by Cr/Bun >20, baseline dementia. Patient calculated to have free water deficit 5.2 L. -D5W 125 mL/hr  -Monitor glucose  -Close monitoring, will recheck BMP tonight and tomorrow  -Speech consult and recommendations appreciated -Follow up, TSH, CMP, serum creatinine, UA -Dc home Lasix  -Continue potassium chloride 10 mg per day   Dementia  Anxiety Challenging to ascertain patient baseline. CT head with significant cerebral atrophy, ex vaco dilation of ventricles, and microvascular changes.   -Continue home Depakote 125 mg per day -Continue home Sertraline 100 mg per day  -Continue home Atarax 10 mg every 8 hours PRN for anxiety  -Continue melatonin  and trazodone  for sleep   -Continue Asprin 81 mg per day -Continue Rosuvastatin 10 mg per day   UTI  >100,000 colony forming units seen on culture, pan susceptible -Continue Keflex 250 mg four times per day currently on day 5/9  GERD -Continue home Protonix 20 mg with breakfast   Severe protein malnutrition  -Continue Ensure     Diet: Soft  VTE: Lovenox IVF: D5,125cc/hr Code: Full  Prior to Admission Living Arrangement: SNF, [facility]  Anticipated Discharge Location: SNF Barriers to Discharge: Hypernatremia   Dispo: Admit patient to Inpatient with expected length of stay greater than 2 midnights.  Signed: Lucretia Roers, Medical  Student Medical Student  Pager: [phone]   08/19/2020,  4:35 PM

## 2020-08-19 NOTE — ED Notes (Signed)
Christina Buck (pt son) called to get update please call back [phone]

## 2020-08-19 NOTE — ED Notes (Signed)
Updated pt's son that pt would be admitted to the hospital per Dr Stevie Kern request

## 2020-08-19 NOTE — ED Notes (Signed)
Pt maintaining O2 sats of 100% on RA at this time

## 2020-08-19 NOTE — ED Notes (Signed)
Patient transported to CT 

## 2020-08-19 NOTE — ED Notes (Signed)
Pt unable to sign MSE waiver.  

## 2020-08-19 NOTE — ED Triage Notes (Signed)
Pt comes from [facility] via EMS, was here last Thursday with UTI and treated, Pt brought today for worsening AMS and fall/slide out of chair. EMS difficulty obtaining O2 reading, pt placed on NRB at 15L, EMS reports pt is calmer on O2, Pt had wittness fall/slide out of chair. Pt not on thinners. Hx of dementia.  BP 101/53 HR 74 BGL 166 RR 20

## 2020-08-19 NOTE — ED Provider Notes (Signed)
[facility] EMERGENCY DEPARTMENT Provider Note   CSN: [id number] Arrival date & time:        History Chief Complaint  Patient presents with  . Altered Mental Status  . Fall    Christina Buck is a 70 y.o. female.  Presents to ER with concern for fall, altered mental status.  Level 5 caveat history limited due to baseline dementia.  History obtained from EMS report, son, chart reviewed.  Patient resides at [facility].  Recently diagnosed with UTI.  Today patient slid out of her chair, possible fall.  Unsure of head trauma.  And also concern for generally worsening mental status.  EMS to difficulty obtaining pulse ox and was placed on 15 L nonrebreather.  Son states patient normally is very confused and does not talk or carry conversations regularly.  HPI     Past Medical History:  Diagnosis Date  . Anxiety   . Arthritis   . Dementia [facility])     Patient Active Problem List   Diagnosis Date Noted  . COVID-19 virus infection 04/26/2019  . Acute respiratory failure with hypoxia (HCC) 04/26/2019  . Protein-calorie malnutrition, severe (HCC) 04/26/2019  . Hypokalemia 04/26/2019    History reviewed. No pertinent surgical history.   OB History   No obstetric history on file.     History reviewed. No pertinent family history.  Social History   Tobacco Use  . Smoking status: Current Every Day Smoker    Packs/day: 0.50    Years: 25.00    Pack years: 12.50    Types: Cigarettes  . Smokeless tobacco: Never Used  Substance Use Topics  . Alcohol use: Yes    Comment: occ  . Drug use: No    Home Medications Prior to Admission medications   Medication Sig Start Date End Date Taking? Authorizing Provider  acetaminophen (TYLENOL) 500 MG tablet Take 500 mg by mouth every 6 (six) hours as needed for pain. 02/23/18   [provider]  Amino Acids-Protein Hydrolys (FEEDING SUPPLEMENT, PRO-STAT SUGAR FREE 64,) LIQD Take 30 mLs by mouth 2 (two)  times daily. 05/01/19   Lorin Glass, MD  aspirin 325 MG tablet Take 325 mg by mouth daily.    [provider]  cephALEXin (KEFLEX) 250 MG capsule Take 1 capsule (250 mg total) by mouth 4 (four) times daily. 08/14/20   Mancel Bale, MD  feeding supplement, ENSURE ENLIVE, (ENSURE ENLIVE) LIQD Take 237 mLs by mouth 2 (two) times daily between meals. 05/01/19   Lorin Glass, MD  ferrous sulfate 325 (65 FE) MG EC tablet Take 325 mg by mouth daily before breakfast. 11/29/17   [provider]  furosemide (LASIX) 20 MG tablet Take 20 mg by mouth daily. 08/31/18   [provider]  hydrOXYzine (ATARAX/VISTARIL) 10 MG tablet Take 10 mg by mouth every 8 (eight) hours as needed for anxiety.    [provider]  Melatonin 3 MG TABS Take 3 mg by mouth at bedtime. 09/06/18   [provider]  omeprazole (PRILOSEC) 20 MG capsule Take 20 mg by mouth daily.    [provider]  ondansetron (ZOFRAN ODT) 4 MG disintegrating tablet 4mg  ODT q4 hours prn nausea/vomit 04/25/20   04/27/20, MD  potassium chloride (KLOR-CON) 10 MEQ tablet Take 10 mEq by mouth daily. 08/31/18   [provider]  sertraline (ZOLOFT) 50 MG tablet Take 50 mg by mouth daily.    [provider]  traZODone (DESYREL)  50 MG tablet Take 50 mg by mouth at bedtime. 11/09/18   [provider]    Allergies    Patient has no known allergies.  Review of Systems   Review of Systems  Unable to perform ROS: Dementia    Physical Exam Updated Vital Signs BP 91/68   Pulse 83   Temp 97.6 F (36.4 C) (Oral)   Resp 17   SpO2 100%   Physical Exam Vitals and nursing note reviewed.  Constitutional:      General: She is not in acute distress.    Appearance: She is well-developed.     Comments: Chronically ill-appearing  HENT:     Head: Normocephalic and atraumatic.  Eyes:     Conjunctiva/sclera: Conjunctivae normal.  Cardiovascular:     Rate and Rhythm: Normal  rate and regular rhythm.     Heart sounds: No murmur heard.   Pulmonary:     Effort: Pulmonary effort is normal. No respiratory distress.     Breath sounds: Normal breath sounds.  Abdominal:     Palpations: Abdomen is soft.     Tenderness: There is no abdominal tenderness.  Musculoskeletal:     Cervical back: Neck supple.  Skin:    General: Skin is warm and dry.  Neurological:     Mental Status: She is alert.     Comments: Alert, follows commands, confused verbal response     ED Results / Procedures / Treatments   Labs (all labs ordered are listed, but only abnormal results are displayed) Labs Reviewed  CBC WITH DIFFERENTIAL/PLATELET - Abnormal; Notable for the following components:      Result Value   MCV 110.1 (*)    All other components within normal limits  BASIC METABOLIC PANEL - Abnormal; Notable for the following components:   Sodium 164 (*)    Chloride 123 (*)    BUN 58 (*)    Creatinine, Ser 1.57 (*)    GFR, Estimated 35 (*)    All other components within normal limits  MAGNESIUM - Abnormal; Notable for the following components:   Magnesium 3.0 (*)    All other components within normal limits  RESP PANEL BY RT-PCR (FLU A&B, COVID) ARPGX2  LACTIC ACID, PLASMA  BRAIN NATRIURETIC PEPTIDE  URINALYSIS, ROUTINE W REFLEX MICROSCOPIC  SODIUM, URINE, RANDOM  CREATININE, URINE, RANDOM  TROPONIN I (HIGH SENSITIVITY)  TROPONIN I (HIGH SENSITIVITY)    EKG EKG Interpretation  Date/Time:  Tuesday August 19 2020 10:12:30 EDT Ventricular Rate:  82 PR Interval:  165 QRS Duration: 129 QT Interval:  419 QTC Calculation: 490 R Axis:   266 Text Interpretation: Sinus or ectopic atrial rhythm Right bundle branch block nonspecific st segment changes No acute changes when compared to prior Confirmed by Marianna Fuss ([id number]) on 08/19/2020 12:24:21 PM   Radiology CT Head Wo Contrast  Result Date: 08/19/2020 CLINICAL DATA:  70 year old female with history of head trauma  presenting with altered mental status. EXAM: CT HEAD WITHOUT CONTRAST CT CERVICAL SPINE WITHOUT CONTRAST TECHNIQUE: Multidetector CT imaging of the head and cervical spine was performed following the standard protocol without intravenous contrast. Multiplanar CT image reconstructions of the cervical spine were also generated. COMPARISON:  Head and cervical spine CT 08/14/2020. FINDINGS: CT HEAD FINDINGS Brain: Severe cerebral and mild cerebellar atrophy with ex vacuo dilatation of the ventricular system. Patchy and confluent areas of decreased attenuation are noted throughout the deep and periventricular white matter of the cerebral hemispheres bilaterally, compatible with chronic  microvascular ischemic disease. No evidence of acute infarction, hemorrhage, hydrocephalus, extra-axial collection or mass lesion/mass effect. Calcifications of the left tentorium cerebelli, similar to the prior examinations. Vascular: No hyperdense vessel or unexpected calcification. Skull: Normal. Negative for fracture or focal lesion. Sinuses/Orbits: No acute finding. Other: None. CT CERVICAL SPINE FINDINGS Alignment: Normal. Skull base and vertebrae: No acute fracture. No primary bone lesion or focal pathologic process. Soft tissues and spinal canal: No prevertebral fluid or swelling. No visible canal hematoma. Disc levels: Multilevel degenerative disc disease, most pronounced at C5-C6 and C6-C7. Mild multilevel facet arthropathy. Upper chest: Unremarkable. Other: None. IMPRESSION: 1. No evidence of significant acute traumatic injury to the skull, brain or cervical spine. 2. Severe cerebral and mild cerebellar atrophy with ex vacuo dilatation of the ventricular system, and extensive chronic microvascular ischemic changes in the cerebral white matter. 3. Multilevel degenerative disc disease and cervical spondylosis, as above. Electronically Signed   By: Trudie Reed M.D.   On: 08/19/2020 11:11   CT Cervical Spine Wo  Contrast  Result Date: 08/19/2020 CLINICAL DATA:  70 year old female with history of head trauma presenting with altered mental status. EXAM: CT HEAD WITHOUT CONTRAST CT CERVICAL SPINE WITHOUT CONTRAST TECHNIQUE: Multidetector CT imaging of the head and cervical spine was performed following the standard protocol without intravenous contrast. Multiplanar CT image reconstructions of the cervical spine were also generated. COMPARISON:  Head and cervical spine CT 08/14/2020. FINDINGS: CT HEAD FINDINGS Brain: Severe cerebral and mild cerebellar atrophy with ex vacuo dilatation of the ventricular system. Patchy and confluent areas of decreased attenuation are noted throughout the deep and periventricular white matter of the cerebral hemispheres bilaterally, compatible with chronic microvascular ischemic disease. No evidence of acute infarction, hemorrhage, hydrocephalus, extra-axial collection or mass lesion/mass effect. Calcifications of the left tentorium cerebelli, similar to the prior examinations. Vascular: No hyperdense vessel or unexpected calcification. Skull: Normal. Negative for fracture or focal lesion. Sinuses/Orbits: No acute finding. Other: None. CT CERVICAL SPINE FINDINGS Alignment: Normal. Skull base and vertebrae: No acute fracture. No primary bone lesion or focal pathologic process. Soft tissues and spinal canal: No prevertebral fluid or swelling. No visible canal hematoma. Disc levels: Multilevel degenerative disc disease, most pronounced at C5-C6 and C6-C7. Mild multilevel facet arthropathy. Upper chest: Unremarkable. Other: None. IMPRESSION: 1. No evidence of significant acute traumatic injury to the skull, brain or cervical spine. 2. Severe cerebral and mild cerebellar atrophy with ex vacuo dilatation of the ventricular system, and extensive chronic microvascular ischemic changes in the cerebral white matter. 3. Multilevel degenerative disc disease and cervical spondylosis, as above.  Electronically Signed   By: Trudie Reed M.D.   On: 08/19/2020 11:11   DG Pelvis Portable  Result Date: 08/19/2020 CLINICAL DATA:  Fall. EXAM: PORTABLE PELVIS 1-2 VIEWS COMPARISON:  None. FINDINGS: There is no evidence of pelvic fracture or diastasis. No pelvic bone lesions are seen. IMPRESSION: Negative. Electronically Signed   By: Obie Dredge M.D.   On: 08/19/2020 10:45   DG Chest Portable 1 View  Result Date: 08/19/2020 CLINICAL DATA:  Hypoxia. EXAM: PORTABLE CHEST 1 VIEW COMPARISON:  04/25/2020 FINDINGS: Assessment is limited by leftward patient rotation, patient kyphosis resulting in obscuration of the left lung apex by the patient's jaw, and overlying telemetry leads. The cardiac silhouette is normal in size. There is mild chronic prominence of the interstitial markings. No overt pulmonary edema, airspace consolidation, sizeable pleural effusion, or pneumothorax is identified within the above described limitations. No acute osseous abnormality is  seen. IMPRESSION: Limited examination without evidence of an acute cardiopulmonary process. Electronically Signed   By: Sebastian AcheAllen  Grady M.D.   On: 08/19/2020 10:46    Procedures .Critical Care Performed by: Milagros Lollykstra, Rhetta Cleek S, MD Authorized by: Milagros Lollykstra, Jillana Selph S, MD   Critical care provider statement:    Critical care time (minutes):  43   Critical care was necessary to treat or prevent imminent or life-threatening deterioration of the following conditions:  CNS failure or compromise and metabolic crisis   Critical care was time spent personally by me on the following activities:  Discussions with consultants, evaluation of patient's response to treatment, examination of patient, ordering and performing treatments and interventions, ordering and review of laboratory studies, ordering and review of radiographic studies, pulse oximetry, re-evaluation of patient's condition, obtaining history from patient or surrogate and review of old charts      Medications Ordered in ED Medications  lactated ringers bolus 500 mL (0 mLs Intravenous Stopped 08/19/20 1522)    ED Course  I have reviewed the triage vital signs and the nursing notes.  Pertinent labs & imaging results that were available during my care of the patient were reviewed by me and considered in my medical decision making (see chart for details).    MDM Rules/Calculators/A&P                         70 year old lady with dementia presents to ER from nursing facility after concern for possible fall and increased mental status changes.  On physical exam, she does not appear to be in any acute distress, her vital signs were stable.  She was taken off 15 L nonrebreather and was 100% on room air.  Trauma work-up was negative.  Basic labs were obtained and concerning for profound hyponatremia.  Also mild AKI.  Suspect significant dehydration.  Provided small bolus of LR.  Consulted unassigned medicine admission.  Discussed case with internal medicine.  Will defer further measures rehydration/correction to admitting team.  Discussed case with son via phone.   Final Clinical Impression(s) / ED Diagnoses Final diagnoses:  Hypernatremia  Dementia without behavioral disturbance, unspecified dementia type (HCC)  Altered mental status, unspecified altered mental status type    Rx / DC Orders ED Discharge Orders    None       Milagros Lollykstra, Newell Wafer S, MD 08/19/20 1554

## 2020-08-19 NOTE — ED Notes (Signed)
Critical sodium of 164 reported by lab. MD and primary RN notified.

## 2020-08-20 DIAGNOSIS — F0390 Unspecified dementia without behavioral disturbance: Secondary | ICD-10-CM

## 2020-08-20 DIAGNOSIS — N390 Urinary tract infection, site not specified: Secondary | ICD-10-CM

## 2020-08-20 DIAGNOSIS — F419 Anxiety disorder, unspecified: Secondary | ICD-10-CM

## 2020-08-20 DIAGNOSIS — E870 Hyperosmolality and hypernatremia: Secondary | ICD-10-CM

## 2020-08-20 DIAGNOSIS — E876 Hypokalemia: Secondary | ICD-10-CM

## 2020-08-20 DIAGNOSIS — E878 Other disorders of electrolyte and fluid balance, not elsewhere classified: Secondary | ICD-10-CM

## 2020-08-20 DIAGNOSIS — B9620 Unspecified Escherichia coli [E. coli] as the cause of diseases classified elsewhere: Secondary | ICD-10-CM

## 2020-08-20 DIAGNOSIS — N179 Acute kidney failure, unspecified: Principal | ICD-10-CM

## 2020-08-20 DIAGNOSIS — K219 Gastro-esophageal reflux disease without esophagitis: Secondary | ICD-10-CM

## 2020-08-20 LAB — FOLATE: Folate: 9.6 ng/mL (ref >5.9)

## 2020-08-20 LAB — VITAMIN B12: Vitamin B-12: 3045 pg/mL — ABNORMAL HIGH (ref 180–914)

## 2020-08-20 LAB — COMPREHENSIVE METABOLIC PANEL
ALT: 23 U/L (ref 0–44)
AST: 31 U/L (ref 15–41)
Albumin: 3.5 g/dL (ref 3.5–5.0)
Alkaline Phosphatase: 69 U/L (ref 38–126)
Anion gap: 8 (ref 5–15)
BUN: 36 mg/dL — ABNORMAL HIGH (ref 8–23)
CO2: 28 mmol/L (ref 22–32)
Calcium: 8.5 mg/dL — ABNORMAL LOW (ref 8.9–10.3)
Chloride: 120 mmol/L — ABNORMAL HIGH (ref 98–111)
Creatinine, Ser: 1.12 mg/dL — ABNORMAL HIGH (ref 0.44–1.00)
GFR, Estimated: 52 mL/min — ABNORMAL LOW (ref >60)
Glucose, Bld: 88 mg/dL (ref 70–99)
Potassium: 3.7 mmol/L (ref 3.5–5.1)
Sodium: 156 mmol/L — ABNORMAL HIGH (ref 135–145)
Total Bilirubin: 1 mg/dL (ref 0.3–1.2)
Total Protein: 6 g/dL — ABNORMAL LOW (ref 6.5–8.1)

## 2020-08-20 LAB — CBC
HCT: 39.5 % (ref 36.0–46.0)
Hemoglobin: 11.6 g/dL — ABNORMAL LOW (ref 12.0–15.0)
MCH: 33.4 pg (ref 26.0–34.0)
MCHC: 29.4 g/dL — ABNORMAL LOW (ref 30.0–36.0)
MCV: 113.8 fL — ABNORMAL HIGH (ref 80.0–100.0)
Platelets: 204 10*3/uL (ref 150–400)
RBC: 3.47 MIL/uL — ABNORMAL LOW (ref 3.87–5.11)
RDW: 13.5 % (ref 11.5–15.5)
WBC: 5.8 10*3/uL (ref 4.0–10.5)
nRBC: 0 % (ref 0.0–0.2)

## 2020-08-20 LAB — SODIUM
Sodium: 154 mmol/L — ABNORMAL HIGH (ref 135–145)
Sodium: 153 mmol/L — ABNORMAL HIGH (ref 135–145)

## 2020-08-20 MED ORDER — TRAZODONE HCL 50 MG PO TABS: 50.0000 mg | ORAL_TABLET | Freq: Every day | ORAL | Status: DC

## 2020-08-20 MED ORDER — ADULT MULTIVITAMIN W/MINERALS CH
1.0000 | ORAL_TABLET | Freq: Every day | ORAL | Status: DC
  Administered 2020-08-20: 1 via ORAL
  Filled 2020-08-20: qty 1

## 2020-08-20 MED ORDER — DIVALPROEX SODIUM 125 MG PO DR TAB
125.0000 mg | DELAYED_RELEASE_TABLET | Freq: Every day | ORAL | Status: DC
  Filled 2020-08-20: qty 1

## 2020-08-20 MED ORDER — MELATONIN 3 MG PO TABS
3.0000 mg | ORAL_TABLET | Freq: Every day | ORAL | Status: DC
  Administered 2020-08-20 – 2020-08-25 (×6): 3 mg via ORAL
  Filled 2020-08-20 (×7): qty 1

## 2020-08-20 MED ORDER — MELATONIN 3 MG PO TABS: 3.0000 mg | ORAL_TABLET | Freq: Every day | ORAL | Status: DC

## 2020-08-20 MED ORDER — SERTRALINE HCL 100 MG PO TABS: 100.0000 mg | ORAL_TABLET | Freq: Every day | ORAL | Status: DC

## 2020-08-20 MED ORDER — CEPHALEXIN 250 MG PO CAPS
250.0000 mg | ORAL_CAPSULE | Freq: Four times a day (QID) | ORAL | Status: DC
  Filled 2020-08-20 (×2): qty 1

## 2020-08-20 MED ORDER — BOOST / RESOURCE BREEZE PO LIQD CUSTOM
1.0000 | Freq: Three times a day (TID) | ORAL | Status: DC
  Administered 2020-08-20 – 2020-08-25 (×15): 1 via ORAL
  Filled 2020-08-20 (×7): qty 1

## 2020-08-20 MED ORDER — ASPIRIN 81 MG PO CHEW: 81.0000 mg | CHEWABLE_TABLET | Freq: Every day | ORAL | Status: DC

## 2020-08-20 MED ORDER — POTASSIUM CHLORIDE 10 MEQ/100ML IV SOLN
10.0000 meq | INTRAVENOUS | Status: AC
Start: 2020-08-20 — End: 2020-08-20
  Administered 2020-08-20 (×4): 10 meq via INTRAVENOUS
  Filled 2020-08-20 (×4): qty 100

## 2020-08-20 MED ORDER — POTASSIUM CHLORIDE CRYS ER 20 MEQ PO TBCR: 40.0000 meq | EXTENDED_RELEASE_TABLET | Freq: Once | ORAL | Status: DC

## 2020-08-20 MED ORDER — PROSOURCE PLUS PO LIQD
30.0000 mL | Freq: Two times a day (BID) | ORAL | Status: DC
  Administered 2020-08-20 – 2020-08-26 (×12): 30 mL via ORAL
  Filled 2020-08-20 (×10): qty 30

## 2020-08-20 NOTE — Evaluation (Signed)
Clinical/Bedside Swallow Evaluation Patient Details  Name: Christina Buck MRN: [id number] Date of Birth: 1950/09/06  Today's Date: 08/20/2020 Time: SLP Start Time (ACUTE ONLY): 1022 SLP Stop Time (ACUTE ONLY): 1045 SLP Time Calculation (min) (ACUTE ONLY): 22.62 min  Past Medical History:  Past Medical History:  Diagnosis Date  . Anxiety   . Arthritis   . Dementia [facility])    Past Surgical History: History reviewed. No pertinent surgical history. HPI:  Christina Buck is a 70 y/o female with severe dementia who was seen at [facility] 08/14/20; found to have UTI w/ pan-sensitve E coli; Christina Buck treated with Keflex and discharged back to facility. Christina Buck presented to the ED on 4/19 after incident of Christina Buck sliding out of her chair and possible fall, unsure of head trauma. CT head 4/19: No evidence of significant acute traumatic injury to the skull, brain or cervical spine. CXR 4/19: Limited examination without evidence of an acute cardiopulmonary process. Christina Buck found to have severe dehydration. A soft diet was recommended on admission, but an NPO order placed. Christina Buck was last seen by speech pathology in December, 2020. She demonstrated intermittent oral holding and occasional signs of aspiration when this behavior was notedl; a dysphagia 1 diet with thin liquids was recommended at that time.   Assessment / Plan / Recommendation Clinical Impression  Christina Buck was seen for bedside swallow evaluation. She was unable to provide any meaningful history or consistently follow commands. She was edentulous and oral mucosa was dry prior to oral care with oral residue noted. She presented with symptoms of oropharyngeal dysphagia, which is likely cognitively based, characterized by reduced bolus awareness, significant oral holding, and occasional coughing following oral holding of thin liquids, suggesting possible aspiration. Christina Buck did not swallow any solids despite verbal or tactile cues, and these boluses were ultimately removed from the oral cavity via  oral suctioning. Christina Buck was unable to suck from a straw despite prompts and cues. Christina Buck's swallow function appears worse than when she was last seen by SLP in 2020 and SLP questions further decline in cognition resulting in deterioration of swallow function. A clear liquid diet will be initiated at this time and SLP will follow to assess any improvement in swallow function. However, considering her advanced dementia, and that long-term non-oral alimentation would not be recommended in this Christina Buck with advanced dementia,, discussions with family regarding GOC will likely be beneficial. SLP Visit Diagnosis: Dysphagia, unspecified (R13.10)    Aspiration Risk  Mild aspiration risk;Moderate aspiration risk    Diet Recommendation Thin liquid (clear liquids)   Liquid Administration via: Cup;No straw Supervision: Full supervision/cueing for compensatory strategies Compensations: Slow rate;Small sips/bites;Minimize environmental distractions Postural Changes: Seated upright at 90 degrees    Other  Recommendations Oral Care Recommendations: Oral care BID   Follow up Recommendations  (TBD)      Frequency and Duration min 2x/week  2 weeks       Prognosis Prognosis for Safe Diet Advancement: Fair Barriers to Reach Goals: Cognitive deficits;Time post onset;Severity of deficits      Swallow Study   General Date of Onset: 08/19/20 HPI: Christina Buck is a 70 y/o female with severe dementia who was seen at [facility] 08/14/20; found to have UTI w/ pan-sensitve E coli; Christina Buck treated with Keflex and discharged back to facility. Christina Buck presented to the ED on 4/19 after incident of Christina Buck sliding out of her chair and possible fall, unsure of head trauma. CT head 4/19: No evidence of significant acute traumatic injury to the skull,  brain or cervical spine. CXR 4/19: Limited examination without evidence of an acute cardiopulmonary process. Christina Buck found to have severe dehydration. A soft diet was recommended on admission, but an NPO order placed.  Christina Buck was last seen by speech pathology in December, 2020. She demonstrated intermittent oral holding and occasional signs of aspiration when this behavior was notedl; a dysphagia 1 diet with thin liquids was recommended at that time. Type of Study: Bedside Swallow Evaluation Previous Swallow Assessment: see HPI Diet Prior to this Study: NPO Temperature Spikes Noted: No Respiratory Status: Room air History of Recent Intubation: No Behavior/Cognition: Alert;Cooperative;Confused;Doesn't follow directions Oral Cavity Assessment: Dry Oral Care Completed by SLP: Recent completion by staff Self-Feeding Abilities: Able to feed self Patient Positioning: Upright in bed;Postural control adequate for testing Baseline Vocal Quality: Normal Volitional Cough: Cognitively unable to elicit Volitional Swallow: Unable to elicit    Oral/Motor/Sensory Function Overall Oral Motor/Sensory Function:  (UTA)   Ice Chips Ice chips: Impaired Presentation: Spoon Oral Phase Impairments: Poor awareness of bolus   Thin Liquid Thin Liquid: Impaired Presentation: Cup Oral Phase Impairments: Poor awareness of bolus Oral Phase Functional Implications: Oral holding Pharyngeal  Phase Impairments: Cough - Delayed (once)    Nectar Thick Nectar Thick Liquid: Not tested   Honey Thick Honey Thick Liquid: Not tested   Puree Puree: Impaired Presentation: Spoon Oral Phase Impairments: Poor awareness of bolus Oral Phase Functional Implications: Oral holding   Solid     Solid: Not tested     Letica Giaimo I. Vear Clock, MS, CCC-SLP Acute Rehabilitation Services Office number [phone] Pager [phone]  Scheryl Marten 08/20/2020,11:14 AM

## 2020-08-20 NOTE — Progress Notes (Signed)
Initial Nutrition Assessment  DOCUMENTATION CODES:  Severe malnutrition in context of chronic illness  INTERVENTION:    Obtain new weight as patient has not been weighed since Feb 2021  Recommend consultation to Palliative Medicine to address GOC   Boost Breeze po TID, each supplement provides 250 kcal and 9 grams of protein  66ml Prosource Plus po BID, each supplement provides 100 kcals and 15 grams of protein  Assist pt with meals   MVI with minerals daily  Although pt is a poor candidate for nutrition support given advanced dementia, if decision is made to continue to pursue full scope of treatment, recommend initiation of tube feeding via Cortrak. Consider: -Osmolite 1.5 @ 70ml/hr, advance by 63ml/hr Q12H until goal rate of 76ml/hr ( ) is reached -50ml Prosource TF daily At goal, TF would provide 1840 kcals, 86g protein, free water (meets 100% of needs)  Please note: Monitor magnesium, potassium, and phosphorus daily for at least 3 days, MD to replete as needed, as pt is at risk for refeeding syndrome.  NUTRITION DIAGNOSIS:  Severe Malnutrition related to chronic illness (dementia) as evidenced by severe fat depletion,severe muscle depletion.  GOAL:  Patient will meet greater than or equal to 90% of their needs  MONITOR:  Labs,Diet advancement,Supplement acceptance,PO intake  REASON FOR ASSESSMENT:  Consult Assessment of nutrition requirement/status  ASSESSMENT:  Pt with PMH significant for advanced dementia, anxiety, GERD, arthritis, and recent UTI presented s/p fall and was admitted with severe hypernatremia and AKI  Pt provided no response to RD during assessment. No family at bedside. Discussed pt with SLP who states plan to advance diet to clear liquids only at this time. SLP informed RD that pt's son noted pt was able to feed herself PTA, but at this time pt will require total assistance. ? Accuracy of pt's ability to feed herself PTA. RD and SLP in  agreement that pt would benefit from Palliative Medicine consult as she is severely malnourished and will be unable to meet her nutritional needs on a clear liquid diet, even if she does eat 100% of the meals and supplements offered. If pt's family would like for the pt to remain full code/scope of treatment, then pt will benefit greatly from a Cortrak given severe malnutrition, insufficient calories provided by clear liquid diet, and likelihood of poor PO intake. Sent secure chat to attending MD, Resident team, and SLP to discuss recommendations/plan of care. Per Dr. Cyndie Chime, plan to consult PMT and readdress GOC with family again today. RD will provide TF recommendations above in case decision is made to pursue Cortrak placement, though please note pt is not an ideal candidate for nutrition support given advanced dementia.   Need new weight obtained as patient has not been weighed since 06/05/2019  No UOP documented.   Medications reviewed.  IVF: D5 @ 150ml/hr Labs: Na 156 (H), Cr 1.12 (H, lower than yesterday)  NUTRITION - FOCUSED PHYSICAL EXAM: Flowsheet Row Most Recent Value  Orbital Region Severe depletion  Upper Arm Region Severe depletion  Thoracic and Lumbar Region Severe depletion  Buccal Region Severe depletion  Temple Region Severe depletion  Clavicle Bone Region Severe depletion  Clavicle and Acromion Bone Region Severe depletion  Scapular Bone Region Severe depletion  Dorsal Hand Severe depletion  Patellar Region Severe depletion  Anterior Thigh Region Severe depletion  Posterior Calf Region Severe depletion  Edema (RD Assessment) None  Hair Reviewed  Eyes Reviewed  Mouth Reviewed  Skin Reviewed  Nails Reviewed  Diet Order:   Diet Order            Diet clear liquid Room service appropriate? No; Fluid consistency: Thin  Diet effective now                EDUCATION NEEDS:  Not appropriate for education at this time  Skin:  Skin Assessment: Reviewed RN  Assessment  Last BM:  08/19/20  Height:  Ht Readings from Last 1 Encounters:  06/05/19 5\' 5"  (1.651 m)    Weight:  Wt Readings from Last 1 Encounters:  06/05/19 67 kg   Ideal Body Weight:  56.82 kg  BMI:  There is no height or weight on file to calculate BMI.  Estimated Nutritional Needs:  Kcal:  1700-1900 Protein:  85-100 grams Fluid:  >1.7L   08/03/19, MS, RD, LDN RD pager number and weekend/on-call pager number located in Amion.

## 2020-08-20 NOTE — Progress Notes (Addendum)
HD#1 Subjective:  Overnight Events: Patient admitted to the hospital    Patient was awake on morning rounds. Patient became mildly agitated during rounds and repeatedly attempted to exit the bed. After a few attempts to redirect she settled back into the bed. She was then occupied with trying to fold the sheets.   Spoke to both of patients sons by phone today. Updated them that speech and dietitian evaluated patient and both recommended comfort care. Explained that patient is at end stage dementia and is not able to swallow on her own. They feel comfortable with palliative consultation, and were going to speak to one another to discuss code status. I will call back this afternoon to see if they reached a decision about this.   Objective:  Vital signs in last 24 hours: Vitals:   08/19/20 1530 08/19/20 1744 08/19/20 2126 08/20/20 0756  BP: 91/68 117/68 107/61 103/78  Pulse: 83 80 72 62  Resp: 17 16 16 19   Temp:  98 F (36.7 C) 97.6 F (36.4 C) 98 F (36.7 C)  TempSrc:  Axillary Oral Oral  SpO2: 100% 100% 99% 98%   Supplemental O2: Room Air O2 Stat 98%  Physical Exam:  Constitutional: Chronically ill appearing woman sitting in bed, in no acute distress HENT: normocephalic atraumatic, mucous membranes very dry Cardiovascular: regular rate and rhythm, no m/r/g Pulmonary/Chest: normal work of breathing on room air, lungs clear to auscultation bilaterally Abdominal: soft, non-tender, non-distended MSK: normal bulk and tone, tender to palpation at ankles bilateral  Neurological: Disoriented, not able to follow simple commands  Skin: warm and dry  There were no vitals filed for this visit.   Intake/Output Summary (Last 24 hours) at 08/20/2020 1509 Last data filed at 08/20/2020 0334 Gross per 24 hour  Intake --  Output 350 ml  Net -350 ml   Net IO Since Admission: -350 mL [08/20/20 1509]  Pertinent Labs: CBC Latest Ref Rng & Units 08/20/2020 08/19/2020 08/19/2020  WBC 4.0 -  10.5 K/uL 5.8 5.9 5.4  Hemoglobin 12.0 - 15.0 g/dL 11.6(L) 12.3 13.1  Hematocrit 36.0 - 46.0 % 39.5 40.1 43.5  Platelets 150 - 400 K/uL 204 233 259    CMP Latest Ref Rng & Units 08/20/2020 08/20/2020 08/19/2020  Glucose 70 - 99 mg/dL - 88 08/21/2020)  BUN 8 - 23 mg/dL - 102(H) 85(I)  Creatinine 0.44 - 1.00 mg/dL - 77(O) 2.42(P)  Sodium 135 - 145 mmol/L 154(H) 156(H) 155(H)  Potassium 3.5 - 5.1 mmol/L - 3.7 3.2(L)  Chloride 98 - 111 mmol/L - 120(H) 120(H)  CO2 22 - 32 mmol/L - 28 27  Calcium 8.9 - 10.3 mg/dL - 8.5(L) 8.4(L)  Total Protein 6.5 - 8.1 g/dL - 6.0(L) -  Total Bilirubin 0.3 - 1.2 mg/dL - 1.0 -  Alkaline Phos 38 - 126 U/L - 69 -  AST 15 - 41 U/L - 31 -  ALT 0 - 44 U/L - 23 -    Imaging: No results found.  Assessment/Plan:   Active Problems:   Hypernatremia   Patient Summary: [place] is a 70 y.o. with a pertinent PMH of end stage dementia, severe protein calorie malnutrition, anxiety, recent UTI who presented with fall and admitted for severe hypernatremia, hyperchloremia and AKI.   Severe Hypernatremia Hyperchloremia   AKI Has hx of hypernatremia. Patient has received 2.6 L of D5W, Na+ now 154. Recalculated free water deficit of 3 L. Will continue to hydrate. Mental status remains unchanged. AKI  is resolving with fluid resuscitation, Cr 1.12 and BUN 36. . Chloride remains elevated at 120.   -D5W 125 mL/hr  -Monitor glucose  -Close monitoring, will recheck BMP tonight and tomorrow  -Continue potassium chloride 10 mg per day    End stage dementia  Anxiety Challenging to ascertain patient baseline. CT head with significant cerebral atrophy, ex vaco dilation of ventricles, and microvascular changes. Speech and dietitian evaluated patient this morning and she was found to not swallow spontaneously, likely due to end stage dementia. Recommendations are appreciated and are talking to family about GOC. Patient placed on clear liquid diet, home medications will be held  at this time. Will consult palliative care.  -Clear liquid diet -Palliative care consulted and we appreciate recommendations  -Continue home Depakote 125 mg per day -Continue home Sertraline 100 mg per day  -Hold home Atarax 10 mg every 8 hours PRN for anxiety  -Continue melatonin 3mg  and trazodone 50mg  for sleep   -Hold Asprin 81 mg per day -Hold Rosuvastatin 10 mg per day    UTI  >100,000 colony forming units of E. Coli seen on culture, pan susceptible -Continue Keflex 250 mg four times per day currently on day 7/9   GERD -Continue home Protonix 20 mg     Severe protein malnutrition  -Prosource Plus liquid 30 mL     Diet:  Clear liquid  IVF: D5,125cc/hr VTE: Enoxaparin Code: Full PT/OT recs: None, none. Family Update: Spoke with both sons today    Dispo: Anticipated discharge to Skilled nursing facility in >2 days pending fluid resuscitation   Medical Student  Pager [phone] Please contact the on call pager after 5 pm and on weekends at [phone].

## 2020-08-20 NOTE — Progress Notes (Signed)
  Date: 08/20/2020  Patient name: [place]  Medical record number: [id number]  Date of birth: 17-Aug-1950   I have seen and evaluated [place] and discussed their care with the Residency Team. Briefly, [place] is a 70 year old woman with PMH of dementia, living in an ALF and recent UTI who presented after she slid out of her chair and possibly had some trauma.  She was further noted on admission to have an elevated Na and an acute kidney injury.  She was noted to be dehydrated.   Vitals:   08/19/20 2126 08/20/20 0756  BP: 107/61 103/78  Pulse: 72 62  Resp: 16 19  Temp: 97.6 F (36.4 C) 98 F (36.7 C)  SpO2: 99% 98%   Gen: Elderly woman, thin, lying in bed, agitated at times, trying to get our of bed.  Eyes: Anicteric sclerae, no injection HENT: Dry MM, neck appears supple, voice is soft CV: RR, NR, no murmur Pulm: Breathing comfortably on room air, no increased work of breathing Abd: Scaphoid, mildly TTP MSK: thin, low bulk, normal tone.  Pain to palpation of bilateral ankles (grimacing) Psych: Does not follow commands, does not appear to be following conversations Neuro: Moving easily in bed (trying to get out of bed), alert.  Not oriented to person/place/situation.   Assessment and Plan: I have seen and evaluated the patient as outlined above. I agree with the formulated Assessment and Plan as detailed in the residents' note, with the following changes:   1. Hypernatremia, dehydration, AKI - IVF with D5W, free water deficit was calculated around 6L, will attempt to replete ~ half of this in first 24 hours - Recommend monitoring Na (BMET) q 12 hours, would not drop Na > 12 in 24 hours - Ensure access to free water and food - Nutrition consult - SLP consult - I anticipate she may be having worsening dementia vs. End stage dementia which is contributing to her current state.  Would consider further GOC discussion and possibly a palliative care discussion.   2.  Dementia/Anxiety - Her home medications will be continued  3. Recent UTI diagnosis, due to E.Coli - Continue Keflex  Other issues per resident/medical student note for the day.   Inez Catalina, MD 4/20/20221:19 PM

## 2020-08-21 DIAGNOSIS — Z515 Encounter for palliative care: Secondary | ICD-10-CM

## 2020-08-21 DIAGNOSIS — E43 Unspecified severe protein-calorie malnutrition: Secondary | ICD-10-CM

## 2020-08-21 DIAGNOSIS — Z7189 Other specified counseling: Secondary | ICD-10-CM

## 2020-08-21 DIAGNOSIS — R339 Retention of urine, unspecified: Secondary | ICD-10-CM

## 2020-08-21 LAB — BASIC METABOLIC PANEL
Anion gap: 5 (ref 5–15)
BUN: 25 mg/dL — ABNORMAL HIGH (ref 8–23)
CO2: 27 mmol/L (ref 22–32)
Calcium: 8.5 mg/dL — ABNORMAL LOW (ref 8.9–10.3)
Chloride: 121 mmol/L — ABNORMAL HIGH (ref 98–111)
Creatinine, Ser: 1 mg/dL (ref 0.44–1.00)
GFR, Estimated: 60 mL/min — ABNORMAL LOW (ref >60)
Glucose, Bld: 98 mg/dL (ref 70–99)
Potassium: 3 mmol/L — ABNORMAL LOW (ref 3.5–5.1)
Sodium: 153 mmol/L — ABNORMAL HIGH (ref 135–145)

## 2020-08-21 LAB — CBC
HCT: 35.2 % — ABNORMAL LOW (ref 36.0–46.0)
Hemoglobin: 10.9 g/dL — ABNORMAL LOW (ref 12.0–15.0)
MCH: 33.3 pg (ref 26.0–34.0)
MCHC: 31 g/dL (ref 30.0–36.0)
MCV: 107.6 fL — ABNORMAL HIGH (ref 80.0–100.0)
Platelets: 203 10*3/uL (ref 150–400)
RBC: 3.27 MIL/uL — ABNORMAL LOW (ref 3.87–5.11)
RDW: 13.2 % (ref 11.5–15.5)
WBC: 5.8 10*3/uL (ref 4.0–10.5)
nRBC: 0 % (ref 0.0–0.2)

## 2020-08-21 MED ORDER — POTASSIUM CHLORIDE 2 MEQ/ML IV SOLN
INTRAVENOUS | Status: DC
  Filled 2020-08-21 (×3): qty 1000

## 2020-08-21 NOTE — Progress Notes (Signed)
CHANGE IN CODE STATUS  [place], [place], updated this afternoon. Discussed natural history of dementia and that his mother has progressed to the end stages. We discussed what this typically entails and how it relates to his mother. I explained that she is likely to decline again after discontinuing IV fluids due to dementia. I explained that our palliative care team has been consulted and will delve deeper into goals of care.   We discussed code status. After further discussion regarding his mother's dementia and quality vs quantity of life, he believes that a transition to DNR would be in his mothers best interest.   [name], MD Internal Medicine Resident PGY-2 [facility] Pager: [phone] 08/21/2020 6:54 PM

## 2020-08-21 NOTE — Progress Notes (Signed)
Noted pt had not voided this shift bladder distened. Bladder scan pt. >325, MD made aware, new order to in and out cath. In and out  Cath of amber color urine.

## 2020-08-21 NOTE — Consult Note (Signed)
Consultation Note Date: 08/21/2020   Patient Name: [place]  DOB: [birth year]  MRN: [id number]  Age / Sex: 70 y.o., female  PCP: Christina Cooley, MD Referring Physician: Inez Catalina, MD  Reason for Consultation: Establishing goals of care "Patient is with end stage dementia, have spoken to family and they are open to persuing comfort care"  HPI/Patient Profile: 70 y.o. female  with past medical history of dementia, severe protein calorie malnutrition, anxiety, GERD, and arthritis who presented to the emergency department on 08/19/2020 after having a fall. CT of head and spine negative for acute traumatic injury; did reveal severe cerebral and mild cerebellar atrophy with ex vacuo dilation of ventricular system and extensive chronic microvascular changes. Patient was found to have severe hypernatremia (sodium 164) and AKI suggestive of severe dehydration. Admitted to IMTS.   Clinical Assessment and Goals of Care: I have reviewed medical records including EPIC notes, labs and imaging, examined the patient, and discussed with bedside RN.   I spoke with patient's sons [place] and [place] by phone  to discuss diagnosis, prognosis, GOC, EOL wishes, disposition, and options. [place] lives in [place] and [place] lives in [place].  I introduced Palliative Medicine as specialized medical care for people living with serious illness. It focuses on providing relief from the symptoms and stress of a serious illness.   We discussed a brief life review of the patient. [place] is originally from [place], [place]. She is the youngest of 9 children. She moved to [place] as an adult to be closer to one of her brothers, and lived there for many years working and raising her 2 sons. Her sons report she worked "many different jobs" while living in [place]; [place] never shared exactly what the jobs were but that it was "hard work". She  relocated back to [place] around 2005-2006 to be closer to some of her other siblings.   As far as functional status, there has been a slow and steady decline over the past several years. [place] has resided in a skilled nursing facility since 2017. Currently, she requires assistance for ambulation and all ADLs.   We discussed her current illness and what it means in the larger context of her ongoing co-morbidities. Detailed discussion was had regarding the diagnosis of dementia and its natural trajectory. We reviewed that dementia is a progressive, non-curable disease underlying the patient's current acute medical conditions. We reviewed specific indicators that patient had reached end stages, including decreased ability to communicate, decreased ability to ambulate, and incontinence of bowel/bladder.  I attempted to elicit values and goals of care important to the patient. Per [place] and [place], the most important thing to [place] was "for her sons to succeed". They express frustration at the care provided in the skilled nursing facility. As patient is unable to feed herself, she relied on staff for assistance. As she was severely dehydrated on admission, they feel staff was not adequately offering fluids. Their ultimate goal is to have [place] placed in a facility in [place], so  she can be closer to [place].   The difference between aggressive medical intervention and comfort care was considered in light of the patient's goals of care.  I introduced the concept of a comfort path to [employer], emphasizing that it includes stopping full scope medical interventions with the goal of comfort and dignity rather than prolonging life.   Provided education and counseling at length on the philosophy and benefits of hospice care. Discussed that it offers a holistic approach to care in the setting of end-stage illness/disease, and is about supporting the patient where they are in their trajectory while allowing  nature to take it's course. Provided information on home (SNF) vs residential hospice services - answered all questions. Discussed that patient would not be eligible for residential hospice at this time because she is eating/drinking. Family is open and receptive to information about hospice - requests time to process and make a decision.   Advanced directives, concepts specific to code status, artifical feeding and hydration, and rehospitalization were considered and discussed. Reviewed that artificial feeding has not been shown to prolong life or promote quality of life in patients with dementia. Family confirms they would not want artificial feeding under any circumstances.   Questions and concerns were addressed.  The family was encouraged to call with questions or concerns.    Primary decision maker: Sons, [place] and [place]    SUMMARY OF RECOMMENDATIONS    DNR/DNI as previously documented  Continue current medical care  Family states their ultimate goal is to have patient placed in a facility in [place] (closer to [place]) - TOC consult placed to provide information on options for out of state placement  Family is considering hosp[facility] care at [facility]  Plan for follow-up GOC discussion tomorrow  Code Status/Advance Care Planning:  DNR  Symptom Management:   Per primary team  Palliative Prophylaxis:   Aspiration and Oral Care, fall precautions  Additional Recommendations (Limitations, Scope, Preferences):  No Artificial Feeding  Psycho-social/Spiritual:   Created space and opportunity for family to express thoughts and feelings regarding patient's current medical situation.   Emotional support provided   Prognosis:   < 6 months  Discharge Planning: SNF, possibly with hospice     Primary Diagnoses: Present on Admission: . Hypernatremia   I have reviewed the medical record, interviewed the patient and family, and examined the patient. The following aspects are  pertinent.  Past Medical History:  Diagnosis Date  . Anxiety   . Arthritis   . Dementia (HCC)    History reviewed. No pertinent family history. Scheduled Meds: . (feeding supplement) PROSource Plus  30 mL Oral BID BM  . enoxaparin (LOVENOX) injection  40 mg Subcutaneous Q24H  . feeding supplement  1 Container Oral TID BM  . melatonin  3 mg Oral QHS   Continuous Infusions: . dextrose 5 % with kcl 100 mL/hr at 08/21/20 0854   PRN Meds:.acetaminophen **OR** acetaminophen Medications Prior to Admission:  Prior to Admission medications   Medication Sig Start Date End Date Taking? Authorizing Provider  acetaminophen (TYLENOL) 500 MG tablet Take 500 mg by mouth every 6 (six) hours as needed for pain. 02/23/18  Yes [provider]  Amino Acids-Protein Hydrolys (FEEDING SUPPLEMENT, PRO-STAT SUGAR FREE 64,) LIQD Take 30 mLs by mouth 2 (two) times daily. 05/01/19  Yes Dahal, Melina Schools, MD  aspirin 81 MG chewable tablet Chew 81 mg by mouth daily.   Yes [provider]  cephALEXin (KEFLEX) 250 MG capsule Take 1  capsule (250 mg total) by mouth 4 (four) times daily. 08/14/20  Yes Mancel Bale, MD  divalproex (DEPAKOTE) 125 MG DR tablet Take 125 mg by mouth at bedtime.   Yes [provider]  feeding supplement, ENSURE ENLIVE, (ENSURE ENLIVE) LIQD Take 237 mLs by mouth 2 (two) times daily between meals. 05/01/19  Yes Dahal, Melina Schools, MD  ferrous sulfate 325 (65 FE) MG EC tablet Take 325 mg by mouth every other day. 11/29/17  Yes [provider]  furosemide (LASIX) 20 MG tablet Take 20 mg by mouth daily. 08/31/18  Yes [provider]  hydrOXYzine (ATARAX/VISTARIL) 10 MG tablet Take 10 mg by mouth every 8 (eight) hours as needed for anxiety.   Yes [provider]  Melatonin 3 MG TABS Take 3 mg by mouth at bedtime. 09/06/18  Yes [provider]  omeprazole (PRILOSEC) 20 MG capsule Take 20 mg by mouth daily.   Yes [provider]  potassium  chloride (KLOR-CON) 10 MEQ tablet Take 10 mEq by mouth daily. 08/31/18  Yes [provider]  rosuvastatin (CRESTOR) 10 MG tablet Take 10 mg by mouth at bedtime.   Yes [provider]  sertraline (ZOLOFT) 100 MG tablet Take 100 mg by mouth daily.   Yes [provider]  traZODone (DESYREL) 50 MG tablet Take 50 mg by mouth at bedtime. 11/09/18  Yes [provider]  vitamin B-12 (CYANOCOBALAMIN) 1000 MCG tablet Take 1,000 mcg by mouth daily.   Yes [provider]   No Known Allergies Review of Systems  Unable to perform ROS: Dementia    Physical Exam Vitals reviewed.  Constitutional:      General: She is not in acute distress.    Appearance: She is cachectic.     Comments: Appears frail  Pulmonary:     Effort: Pulmonary effort is normal.  Neurological:     Mental Status: She is alert.     Motor: Weakness present.  Psychiatric:        Cognition and Memory: Cognition is impaired.     Vital Signs: BP 98/80 (BP Location: Right Leg)   Pulse (!) 104   Temp 98.4 F (36.9 C) (Axillary)   Resp 20   Wt 42.1 kg   SpO2 95%   BMI 15.44 kg/m  Pain Scale: PAINAD POSS *See Group Information*: S-Acceptable,Sleep, easy to arouse Pain Score: 0-No pain   SpO2: SpO2: 95 % O2 Device:SpO2: 95 %  IO: Intake/output summary:   Intake/Output Summary (Last 24 hours) at 08/21/2020 1301 Last data filed at 08/21/2020 0730 Gross per 24 hour  Intake 2356.32 ml  Output --  Net 2356.32 ml    LBM: Last BM Date: 08/19/20 Baseline Weight: Weight: 42.1 kg Most recent weight: Weight: 42.1 kg      Palliative Assessment/Data: PPS 30%     Time In: 1530 Time Out: 1642 Time Total: 72 minutes Greater than 50%  of this time was spent counseling and coordinating care related to the above assessment and plan.  Signed by: Merry Proud, NP   Please contact Palliative Medicine Team phone at [phone] for questions and concerns.  For individual provider: See  Loretha Stapler

## 2020-08-21 NOTE — Progress Notes (Signed)
  Speech Language Pathology Treatment: Dysphagia  Patient Details Name: [place] MRN: [id number] DOB: 01-Nov-1950 Today's Date: 08/21/2020 Time: 3300-7622 SLP Time Calculation (min) (ACUTE ONLY): 16.63 min  Assessment / Plan / Recommendation Clinical Impression  Pt was seen for dysphagia treatment. She was alert throughout the session and benefited from reduced distractions in the room as well as verbal prompts and tactile cues to initiate feeding. Pt was otherwise resistant to p.o. intake outside of self-feeding. Pt tolerated puree solids, purees, and thin liquids via cup without overt s/sx of aspiration. She demonstrated inconsistent (twice during intake of ~7oz water) coughing with thin liquids via straw. Oral holding was minimal and oral clearance adequate. Pt's swallow function appears improved compared to yesterday and her diet will be advanced to dysphagia 1 and thin liquids. SLP will continue to follow pt.      HPI HPI: Pt is a 70 y/o female with severe dementia who was seen at [facility] 08/14/20; found to have UTI w/ pan-sensitve E coli; pt treated with Keflex and discharged back to facility. Pt presented to the ED on 4/19 after incident of pt sliding out of her chair and possible fall, unsure of head trauma. CT head 4/19: No evidence of significant acute traumatic injury to the skull, brain or cervical spine. CXR 4/19: Limited examination without evidence of an acute cardiopulmonary process. Pt found to have severe dehydration. A soft diet was recommended on admission, but an NPO order placed. Pt was last seen by speech pathology in December, 2020. She demonstrated intermittent oral holding and occasional signs of aspiration when this behavior was notedl; a dysphagia 1 diet with thin liquids was recommended at that time.      SLP Plan  Continue with current plan of care       Recommendations  Diet recommendations: Dysphagia 1 (puree);Thin liquid Liquids provided via: Cup;No  straw Medication Administration: Crushed with puree Supervision: Staff to assist with self feeding;Full supervision/cueing for compensatory strategies Compensations: Slow rate;Small sips/bites;Minimize environmental distractions Postural Changes and/or Swallow Maneuvers: Seated upright 90 degrees                Oral Care Recommendations: Oral care BID Follow up Recommendations:  (TBD) SLP Visit Diagnosis: Dysphagia, unspecified (R13.10) Plan: Continue with current plan of care       Aubry Rankin I. Vear Clock, MS, CCC-SLP Acute Rehabilitation Services Office number [phone] Pager [phone]                Scheryl Marten 08/21/2020, 10:05 AM

## 2020-08-21 NOTE — Plan of Care (Signed)

## 2020-08-21 NOTE — Progress Notes (Signed)
HD#2 Subjective:  Overnight Events: Patient was restless last night, night team notified and patient provided melatonin.    Patient was much more alert this morning at morning rounds. She was smiling and engaging. She could answer simple conversations and was able to tell me her name, the name of her son Link Snuffer and give me a thumbs up.   Objective:  Vital signs in last 24 hours: Vitals:   08/20/20 1648 08/20/20 2224 08/21/20 0721 08/21/20 1436  BP: 100/87 98/80  98/67  Pulse: 70 (!) 104  69  Resp: 20 20  17   Temp: 97.7 F (36.5 C) 98.4 F (36.9 C)  98.5 F (36.9 C)  TempSrc:  Axillary  Oral  SpO2: 98% 95%  100%  Weight:   42.1 kg    Supplemental O2: Room Air   Physical Exam:  Constitutional: chronically ill appearing woman sitting in bed, in no acute distress HENT: normocephalic atraumatic, mucous membranes dry  Eyes: conjunctiva non-erythematous Cardiovascular: regular rate and rhythm, no m/r/g Pulmonary/Chest: normal work of breathing on room air, lungs clear to auscultation bilaterally Abdominal: soft, non-tender, non-distended MSK: cachectic  Neurological: alert & oriented to self  Skin: warm and dry  Filed Weights   08/21/20 0721  Weight: 42.1 kg     Intake/Output Summary (Last 24 hours) at 08/21/2020 1548 Last data filed at 08/21/2020 1421 Gross per 24 hour  Intake 2676.32 ml  Output --  Net 2676.32 ml   Net IO Since Admission: 2,326.32 mL [08/21/20 1548]  Pertinent Labs: CBC Latest Ref Rng & Units 08/21/2020 08/20/2020 08/19/2020  WBC 4.0 - 10.5 K/uL 5.8 5.8 5.9  Hemoglobin 12.0 - 15.0 g/dL 10.9(L) 11.6(L) 12.3  Hematocrit 36.0 - 46.0 % 35.2(L) 39.5 40.1  Platelets 150 - 400 K/uL 203 204 233    CMP Latest Ref Rng & Units 08/21/2020 08/20/2020 08/20/2020  Glucose 70 - 99 mg/dL 98 - -  BUN 8 - 23 mg/dL 08/22/2020) - -  Creatinine 20(U - 1.00 mg/dL 5.42 - -  Sodium 7.06 - 145 mmol/L 153(H) 153(H) 154(H)  Potassium 3.5 - 5.1 mmol/L 3.0(L) - -  Chloride 98 -  111 mmol/L 121(H) - -  CO2 22 - 32 mmol/L 27 - -  Calcium 8.9 - 10.3 mg/dL 237) - -  Total Protein 6.5 - 8.1 g/dL - - -  Total Bilirubin 0.3 - 1.2 mg/dL - - -  Alkaline Phos 38 - 126 U/L - - -  AST 15 - 41 U/L - - -  ALT 0 - 44 U/L - - -    Imaging: No results found.  Assessment/Plan:   Active Problems:   Hypernatremia   Dementia without behavioral disturbance [facility])   Patient Summary: Christina Buck is a 70 y.o. with a pertinent PMH of end stage dementia, severe protein calorie malnutrition, anxiety, recent UTI who presented with fall and admitted for severe hypernatremia, hyperchloremia and AKI.   Severe Hypernatremia Hyperchloremia  AKI Hypokalemia  Has hx of hypernatremia. Patient has received ~5.0 L of D5W, Na+ now 153. Patient weighed today and free water deficit with current weight is 1.8L. Clinically she is improved and more alert today. Will continue to hydrate. AKI is resolving with fluid resuscitation, Cr 1.00 and BUN 25. . Chloride remains elevated at 121. Patient with K+ of 3.0  this morning, will change IVF to D5W w/ 40 mEq of KCl -D5W w/ 40 mEq KCl 100 mL/hr -Monitor glucose -Close monitoring, will recheck BMP tomorrow  morning    End stage dementia  Anxiety Challenging to ascertain patient baseline, though appears more alert today than yesterday. Has had a couple of episodes of anxiety but has been able to redirect. Has 1:1 sitter to endure patient does not get up and fall.  -Clear liquid diet -Palliative care consulted and we appreciate recommendations  -Hold home Depakote 125 mg per day -Hold home Sertraline 100 mg per day  -Hold home Atarax 10 mg every 8 hours PRN for anxiety  -Continue melatonin 3mg  and trazodone 50mg  for sleep  -Hold Asprin 81 mg per day -Hold Rosuvastatin 10 mg per day   UTI  >100,000 colony forming units of E. Coli seen on culture, pan susceptible. Patient has been unable to take Keflex due to not being able to swallow  pills. Patient received 5 doses. Is not tender to palpation. Consider UA prior to discharge.   Urinary retention  Nurse paged that she had not voided all day. Bladder scan showed greater than 320 mL.  -Straight catheter today and will reevaluate tomorrow     GERD -Hold home Protonix 20 mg    Severe protein malnutrition -Prosource Plus liquid 30 mL  -Boost   Diet: Liquids  IVF: D5,100cc/hr VTE: Enoxaparin Code: Full TOC recs: GOC  Family Update: Will contact son today    Dispo: Anticipated discharge to Skilled nursing facility in >2 days pending fluid resuscitation   Medical Student  Pager [phone] Please contact the on call pager after 5 pm and on weekends at [phone].

## 2020-08-22 DIAGNOSIS — Z66 Do not resuscitate: Secondary | ICD-10-CM

## 2020-08-22 LAB — BASIC METABOLIC PANEL
Anion gap: 3 — ABNORMAL LOW (ref 5–15)
BUN: 18 mg/dL (ref 8–23)
CO2: 26 mmol/L (ref 22–32)
Calcium: 8.2 mg/dL — ABNORMAL LOW (ref 8.9–10.3)
Chloride: 112 mmol/L — ABNORMAL HIGH (ref 98–111)
Creatinine, Ser: 0.78 mg/dL (ref 0.44–1.00)
GFR, Estimated: >60 mL/min (ref >60)
Glucose, Bld: 94 mg/dL (ref 70–99)
Potassium: 3.7 mmol/L (ref 3.5–5.1)
Sodium: 141 mmol/L (ref 135–145)

## 2020-08-22 LAB — CBC
HCT: 35.5 % — ABNORMAL LOW (ref 36.0–46.0)
Hemoglobin: 11 g/dL — ABNORMAL LOW (ref 12.0–15.0)
MCH: 33.2 pg (ref 26.0–34.0)
MCHC: 31 g/dL (ref 30.0–36.0)
MCV: 107.3 fL — ABNORMAL HIGH (ref 80.0–100.0)
Platelets: 206 10*3/uL (ref 150–400)
RBC: 3.31 MIL/uL — ABNORMAL LOW (ref 3.87–5.11)
RDW: 12.8 % (ref 11.5–15.5)
WBC: 6.2 10*3/uL (ref 4.0–10.5)
nRBC: 0 % (ref 0.0–0.2)

## 2020-08-22 MED ORDER — DEXTROSE 5 % IV SOLN: INTRAVENOUS | Status: DC

## 2020-08-22 MED ORDER — LOPERAMIDE HCL 1 MG/7.5ML PO SUSP
2.0000 mg | ORAL | Status: DC | PRN
  Administered 2020-08-22: 2 mg via ORAL
  Filled 2020-08-22 (×3): qty 15

## 2020-08-22 MED ORDER — ALUM & MAG HYDROXIDE-SIMETH 200-200-20 MG/5ML PO SUSP: 30.0000 mL | Freq: Four times a day (QID) | ORAL | Status: DC | PRN

## 2020-08-22 MED ORDER — POTASSIUM CHLORIDE 20 MEQ PO PACK
40.0000 meq | PACK | Freq: Every day | ORAL | Status: DC
  Administered 2020-08-22 – 2020-08-26 (×5): 40 meq via ORAL
  Filled 2020-08-22 (×5): qty 2

## 2020-08-22 MED ORDER — LOPERAMIDE HCL 2 MG PO CAPS: 2.0000 mg | ORAL_CAPSULE | ORAL | Status: DC | PRN

## 2020-08-22 NOTE — Plan of Care (Signed)
  Problem: Education: Goal: Knowledge of General Education information will improve Description: Including pain rating scale, medication(s)/side effects and non-pharmacologic comfort measures 08/22/2020 1957 by Aubery Lapping, RN Outcome: Progressing 08/22/2020 1935 by Aubery Lapping, RN Outcome: Progressing   Problem: Health Behavior/Discharge Planning: Goal: Ability to manage health-related needs will improve 08/22/2020 1957 by Aubery Lapping, RN Outcome: Progressing 08/22/2020 1935 by Aubery Lapping, RN Outcome: Progressing   Problem: Clinical Measurements: Goal: Ability to maintain clinical measurements within normal limits will improve 08/22/2020 1957 by Aubery Lapping, RN Outcome: Progressing 08/22/2020 1935 by Aubery Lapping, RN Outcome: Progressing Goal: Will remain free from infection 08/22/2020 1957 by Aubery Lapping, RN Outcome: Progressing 08/22/2020 1935 by Aubery Lapping, RN Outcome: Progressing Goal: Diagnostic test results will improve 08/22/2020 1957 by Aubery Lapping, RN Outcome: Progressing 08/22/2020 1935 by Aubery Lapping, RN Outcome: Progressing Goal: Respiratory complications will improve 08/22/2020 1957 by Aubery Lapping, RN Outcome: Progressing 08/22/2020 1935 by Aubery Lapping, RN Outcome: Progressing Goal: Cardiovascular complication will be avoided 08/22/2020 1957 by Aubery Lapping, RN Outcome: Progressing 08/22/2020 1935 by Aubery Lapping, RN Outcome: Progressing   Problem: Activity: Goal: Risk for activity intolerance will decrease 08/22/2020 1957 by Aubery Lapping, RN Outcome: Progressing 08/22/2020 1935 by Aubery Lapping, RN Outcome: Progressing   Problem: Nutrition: Goal: Adequate nutrition will be maintained 08/22/2020 1957 by Aubery Lapping, RN Outcome: Progressing 08/22/2020 1935 by Aubery Lapping, RN Outcome: Progressing   Problem:  Coping: Goal: Level of anxiety will decrease 08/22/2020 1957 by Aubery Lapping, RN Outcome: Progressing 08/22/2020 1935 by Aubery Lapping, RN Outcome: Progressing   Problem: Elimination: Goal: Will not experience complications related to bowel motility 08/22/2020 1957 by Aubery Lapping, RN Outcome: Progressing 08/22/2020 1935 by Aubery Lapping, RN Outcome: Progressing Goal: Will not experience complications related to urinary retention 08/22/2020 1957 by Aubery Lapping, RN Outcome: Progressing 08/22/2020 1935 by Aubery Lapping, RN Outcome: Progressing   Problem: Pain Managment: Goal: General experience of comfort will improve 08/22/2020 1957 by Aubery Lapping, RN Outcome: Progressing 08/22/2020 1935 by Aubery Lapping, RN Outcome: Progressing   Problem: Safety: Goal: Ability to remain free from injury will improve 08/22/2020 1957 by Aubery Lapping, RN Outcome: Progressing 08/22/2020 1935 by Aubery Lapping, RN Outcome: Progressing   Problem: Skin Integrity: Goal: Risk for impaired skin integrity will decrease 08/22/2020 1957 by Aubery Lapping, RN Outcome: Progressing 08/22/2020 1935 by Aubery Lapping, RN Outcome: Progressing

## 2020-08-22 NOTE — Care Management Important Message (Signed)
Important Message  Patient Details  Name: Christina Buck MRN: [id number] Date of Birth: Aug 19, 1950   Medicare Important Message Given:  Yes     Christina Buck 08/22/2020, 2:10 PM

## 2020-08-22 NOTE — Progress Notes (Signed)
Daily Progress Note   Patient Name: Christina Buck       Date: 9/38/1017 DOB: 03-21-1951  Age: 70 y.o. MRN#: [id number] Attending Physician: Inez Catalina, MD Primary Care Physician: Selina Cooley, MD Admit Date: 08/19/2020  Reason for Consultation/Follow-up: Establishing goals of care "Patient is with end stage dementia, have spoken to family and they are open to persuing comfort care"  Subjective: Spoke with son Link Snuffer by phone. Attempted to conference in [place] as well, but got his voicemail.  Discussed that difficulty with facilitating move to [place] appeared to be related to medicaid issues, as this does not transfer state to state. Discussed they would likely need to apply for patient to have medicaid in [place].   Link Snuffer shares that he and Jaynie Collins had a chance to speak amongst themselves after our discussion yesterday, and agree that hospice care would be beneficial for their mother in the setting of end stage dementia.  He reports that they would prefer for her not to return [facility], but understand there may not be additional options.    Length of Stay: 3  Current Medications: Scheduled Meds:  . (feeding supplement) PROSource Plus  30 mL Oral BID BM  . enoxaparin (LOVENOX) injection  40 mg Subcutaneous Q24H  . feeding supplement  1 Container Oral TID BM  . melatonin  3 mg Oral QHS  . potassium chloride  40 mEq Oral Daily      PRN Meds: acetaminophen **OR** acetaminophen, loperamide HCl    Vital Signs: BP 100/71 (BP Location: Left Arm)   Pulse 66   Temp 98 F (36.7 C) (Oral)   Resp 18   Wt 42.1 kg   SpO2 100%   BMI 15.44 kg/m  SpO2: SpO2: 100 % O2 Device: O2 Device: Room Air O2 Flow Rate: O2 Flow Rate (L/min): 15 L/min  Intake/output summary:    Intake/Output Summary (Last 24 hours) at 08/22/2020 1622 Last data filed at 08/22/2020 0900 Gross per 24 hour  Intake 1334.96 ml  Output 350 ml  Net 984.96 ml   LBM: Last BM Date: 08/22/20 Baseline Weight: Weight: 42.1 kg Most recent weight: Weight: 42.1 kg       Palliative Assessment/Data: PPS 30%      Palliative Care Assessment & Plan   HPI/Patient Profile: 70 y.o. female  with past medical history of dementia, severe protein calorie malnutrition, anxiety, GERD, and arthritis who presented to the emergency department on 08/19/2020 after having a fall. CT of head and spine negative for acute traumatic injury; did reveal severe cerebral and mild cerebellar atrophy with ex vacuo dilation of ventricular system and extensive chronic microvascular changes. Patient was found to have severe hypernatremia (sodium 164) and AKI suggestive of severe dehydration. Admitted to IMTS.   Assessment: - severe hypernatremia - hyperchloremia - AKI - end stage dementia - anxiety - UTI - severe protein malnutrition  Recommendations/Plan:  DNR/DNI as previously documented  Continue current medical care  Family confirms they want hospice care when patient is discharged back to SNF  Ultimate goal is to have patient transferred to a facility in [place] (closer to [place]) - they are speaking with LCSW for guidance regarding this  PMT will continue to follow  Goals of Care and Additional Recommendations:  Limitations on Scope of Treatment: No Artificial Feeding  Code Status: DNR/DNI  Prognosis:   < 6 months  Discharge Planning:  Skilled Nursing Facility with Hospice  Care plan was discussed with LCSW  Thank you for allowing the Palliative Medicine Team to assist in the care of this patient.   Total Time 25 minutes Prolonged Time Billed  no       Greater than 50%  of this time was spent counseling and coordinating care related to the above assessment and plan.  Merry Proud, NP  Please contact Palliative Medicine Team phone at [phone] for questions and concerns.

## 2020-08-22 NOTE — Plan of Care (Signed)

## 2020-08-22 NOTE — TOC Initial Note (Signed)
Transition of Care [facility]) - Initial/Assessment Note    Patient Details  Name: Christina Buck MRN: [id number] Date of Birth: 01-25-1951  [facility]) CM/SW Contact:    Lorri Frederick, LCSW Phone Number: 08/22/2020, 3:08 PM  Clinical Narrative: Pt unable to participate in conversation/assessment.  CSW spoke with son Jaynie Collins.  Discussions with palliative are ongoing and they are considering options.  Jaynie Collins reports he has been attempting to facilitate move of his mother to [place] for the past 18 months and has been unsuccessful.  [place] does not recognize the [place] and medicaid transfer to [place] has also been a barrier.  CSW will continue to follow regarding GOC decisions and discharge disposition.                    Expected Discharge Plan: Assisted Living Barriers to Discharge: Continued Medical Work up   Patient Goals and CMS Choice Patient states their goals for this hospitalization and ongoing recovery are:: TBD      Expected Discharge Plan and Services Expected Discharge Plan: Assisted Living In-house Referral: Clinical Social Work   Post Acute Care Choice:  (choice still pending) Living arrangements for the past 2 months: Assisted Living Facility                                      Prior Living Arrangements/Services Living arrangements for the past 2 months: Assisted Living Facility Lives with:: Facility Resident          Need for Family Participation in Patient Care: Yes (Comment) Care giver support system in place?: Yes (comment) Current home services: Other (comment) (none) Criminal Activity/Legal Involvement Pertinent to Current Situation/Hospitalization: No - Comment as needed  Activities of Daily Living      Permission Sought/Granted                  Emotional Assessment Appearance:: Appears stated age Attitude/Demeanor/Rapport: Unable to Assess Affect (typically observed): Unable to Assess Orientation: : Oriented to  Self Alcohol / Substance Use: Not Applicable Psych Involvement: No (comment)  Admission diagnosis:  Hypernatremia [E87.0] Altered mental status, unspecified altered mental status type [R41.82] Dementia without behavioral disturbance, unspecified dementia type (HCC) [F03.90] Patient Active Problem List   Diagnosis Date Noted  . Dementia without behavioral disturbance (HCC)   . Hypernatremia 08/19/2020  . COVID-19 virus infection 04/26/2019  . Acute respiratory failure with hypoxia (HCC) 04/26/2019  . Protein-calorie malnutrition, severe (HCC) 04/26/2019  . Hypokalemia 04/26/2019   PCP:  Selina Cooley, MD Pharmacy:  No Pharmacies Listed    Social Determinants of Health (SDOH) Interventions    Readmission Risk Interventions No flowsheet data found.

## 2020-08-22 NOTE — Progress Notes (Signed)
HD#3 Subjective:  Overnight Events: Patient was able to communicate need to void and was helped to toilet. Nurse reported patient had black tarry stool overnight with green slime.    Patient was alert this morning on rounds and had a big smile. She was still not oriented to place or time, but was able to answer simple questions with one work answers. Continues to string words together, but not in a logical way. Was able to say she had to pee, but preferred not to get up and go to restroom. Denies any pain.   Objective:  Vital signs in last 24 hours: Vitals:   08/21/20 1436 08/21/20 1900 08/22/20 0300 08/22/20 0728  BP: 98/67 (!) 101/59 90/68 100/71  Pulse: 69 71 76 66  Resp: 17 20 19 18   Temp: 98.5 F (36.9 C)  98.1 F (36.7 C) 98 F (36.7 C)  TempSrc: Oral Oral Oral Oral  SpO2: 100% 100% 97% 100%  Weight:       Supplemental O2: Room Air SpO2 100%  Physical Exam:  Constitutional: Chronically ill appearing woman sitting in bed, in no acute distress HENT: normocephalic atraumatic, mucous membranes dry Cardiovascular: regular rate and rhythm, no m/r/g Pulmonary/Chest: normal work of breathing on room air, lungs clear to auscultation bilaterally Abdominal: soft, non-tender, non-distended, reduced bowel sounds  MSK: Cachectic  Neurological: alert & oriented to self  Skin: warm and dry Psych: Very pleasant   Filed Weights   08/21/20 0721  Weight: 42.1 kg     Intake/Output Summary (Last 24 hours) at 08/22/2020 0806 Last data filed at 08/21/2020 1828 Gross per 24 hour  Intake 1414.96 ml  Output 350 ml  Net 1064.96 ml   Net IO Since Admission: 3,071.28 mL [08/22/20 0806]  Pertinent Labs: CBC Latest Ref Rng & Units 08/21/2020 08/20/2020 08/19/2020  WBC 4.0 - 10.5 K/uL 5.8 5.8 5.9  Hemoglobin 12.0 - 15.0 g/dL 10.9(L) 11.6(L) 12.3  Hematocrit 36.0 - 46.0 % 35.2(L) 39.5 40.1  Platelets 150 - 400 K/uL 203 204 233    CMP Latest Ref Rng & Units 08/21/2020 08/20/2020 08/20/2020   Glucose 70 - 99 mg/dL 98 - -  BUN 8 - 23 mg/dL 08/22/2020) - -  Creatinine 69(G - 1.00 mg/dL 2.95 - -  Sodium 2.84 - 145 mmol/L 153(H) 153(H) 154(H)  Potassium 3.5 - 5.1 mmol/L 3.0(L) - -  Chloride 98 - 111 mmol/L 121(H) - -  CO2 22 - 32 mmol/L 27 - -  Calcium 8.9 - 10.3 mg/dL 132) - -  Total Protein 6.5 - 8.1 g/dL - - -  Total Bilirubin 0.3 - 1.2 mg/dL - - -  Alkaline Phos 38 - 126 U/L - - -  AST 15 - 41 U/L - - -  ALT 0 - 44 U/L - - -    Imaging: No results found.  Assessment/Plan:   Active Problems:   Hypernatremia   Dementia without behavioral disturbance Christina Buck)   Patient Summary: Christina Buck is a 70 y.o. with a pertinent PMH of end stage dementia, severe protein calorie malnutrition, anxiety, recent UTI who presented with fall and admitted for severe hypernatremia, hyperchloremia and AKI.   Severe Hypernatremia Hyperchloremia   AKI Hypokalemia  Has hx of hypernatremia. Her Na+ this morning was 141 down from 153 yesterday. Free water deficit has resolved. Clinically she is improved and continues to be more alert today. She is urinating and making BM. AKI has resolved with Cr 0.78 and BUN of 18. 61  Chloride also improved to 112.Marland Kitchen Patient received 80 mEq of K+ yesterday and K+ this morning of 3.7. Speech therapy yesterday put her on dysphagia 1. Will attempt to stop IV fluids and encourage PO intake. Will continue to replenish K+.  - D/c D5W w/ 40 mEq KCl 100 mL/hr -Encourage PO intake of fluids  -Monitor glucose  -Close monitoring, will recheck BMP tomorrow morning      End stage dementia  Anxiety Challenging to ascertain patient baseline, patient is improving every day with fluids. She was more engaged this morning but continues to be alert only to self. She appears happy and free from pain or discomfort. She was able to use toilet last night with assistance. Spoke with nurse and they believe patient still requires 1:1 sitter because she continues to try to leave the bed.   -Dysphagia 1  -1:1 sitter   UTI  >100,000 colony forming units of E. Coli seen on culture, pan susceptible. Patient has been unable to take Keflex due to not being able to swallow pills. Patient received 5 doses. Is not tender to palpation. Consider UA prior to discharge.    Urinary retention  Nurse paged that she had not voided all day yesterday. Bladder scan showed greater than 320 mL and straight cathader was used to assist with void. Per nurse, there was 350 mL amber colored urine. Patient was able to void on own overnight and continues to void spontaneously today.  -Continue to monitor    GERD -Hold home Protonix 20 mg    Severe protein malnutrition  -Prosource Plus liquid 30 mL    -Boost   GOC We spoke with patients son Christina Buck last night and he confirmed DNR. Palliative evaluated patient yesterday and spoke with both sons as per note. Sons are hoping to move [name] to an assisted living facility in [place], closer to her son Christina Buck, so Christina Buck lives in [place]. As per note, family would like time to consider options for GOC and will speak to palliative again today.  -Appreciate palliative consult and recommendations.   Diet:  Clear liquids  IVF: D5,100cc/hr VTE: Enoxaparin Code: DNR/DNI PT/OT recs: None, none. TOC recs: Placement in out of state SNF  Family Update: Will update son again today.    Dispo: Anticipated discharge to Skilled nursing facility in >2 days pending placement   Christina Buck Medical Student  Pager [phone]  Please contact the on call pager after 5 pm and on weekends at [phone].

## 2020-08-23 LAB — RENAL FUNCTION PANEL
Albumin: 2.8 g/dL — ABNORMAL LOW (ref 3.5–5.0)
Anion gap: 6 (ref 5–15)
BUN: 16 mg/dL (ref 8–23)
CO2: 24 mmol/L (ref 22–32)
Calcium: 8.3 mg/dL — ABNORMAL LOW (ref 8.9–10.3)
Chloride: 111 mmol/L (ref 98–111)
Creatinine, Ser: 0.75 mg/dL (ref 0.44–1.00)
GFR, Estimated: >60 mL/min (ref >60)
Glucose, Bld: 92 mg/dL (ref 70–99)
Phosphorus: 3.3 mg/dL (ref 2.5–4.6)
Potassium: 4 mmol/L (ref 3.5–5.1)
Sodium: 141 mmol/L (ref 135–145)

## 2020-08-23 NOTE — Progress Notes (Signed)
   Subjective: no significant overnight events.   Objective:  Vital signs in last 24 hours: Vitals:   08/22/20 0300 08/22/20 0728 08/22/20 2130 08/23/20 0452  BP: 90/68 100/71 (!) 145/70 (!) 165/80  Pulse: 76 66 78 80  Resp: 19 18 19 19   Temp: 98.1 F (36.7 C) 98 F (36.7 C) 98 F (36.7 C) 98 F (36.7 C)  TempSrc: Oral Oral Oral Oral  SpO2: 97% 100% 97% 99%  Weight:       General: elderly, catchetic appearing HENT: MMM Cardiac: RRR, extremities warm   Assessment/Plan:  Active Problems:   Hypernatremia   Dementia without behavioral disturbance (HCC)  Hypovolemic hypernatremia, hypokalemia (resolved). Fluids off since noon yesterday. Na stable at 141.  -encourage oral intake -daily RFPs -I think it's reasonable to consider discharge later today or tomorrow however may return if fluids can not be sufficiently taken in by po.   End stage dementia, Goals of Care: appreciate palliative consult. Awaiting goals of care discussion.   Best practice Code: DNR VTE prophylaxis: lovenox Dispo: medically stable for discharge in 0-1D. Will likely need to continue to work on transfer to [place] facility after returning to her skilled facility  [place], MD Internal Medicine Resident PGY-2 [name] Pager: [phone] After 5pm on weekdays and 1pm on weekends: On Call pager [phone] 08/23/2020 8:40 AM

## 2020-08-24 DIAGNOSIS — E861 Hypovolemia: Secondary | ICD-10-CM

## 2020-08-24 DIAGNOSIS — R451 Restlessness and agitation: Secondary | ICD-10-CM

## 2020-08-24 DIAGNOSIS — F0391 Unspecified dementia with behavioral disturbance: Secondary | ICD-10-CM

## 2020-08-24 LAB — RENAL FUNCTION PANEL
Albumin: 3.1 g/dL — ABNORMAL LOW (ref 3.5–5.0)
Anion gap: 9 (ref 5–15)
BUN: 19 mg/dL (ref 8–23)
CO2: 24 mmol/L (ref 22–32)
Calcium: 8.9 mg/dL (ref 8.9–10.3)
Chloride: 110 mmol/L (ref 98–111)
Creatinine, Ser: 0.77 mg/dL (ref 0.44–1.00)
GFR, Estimated: >60 mL/min (ref >60)
Glucose, Bld: 72 mg/dL (ref 70–99)
Phosphorus: 3.2 mg/dL (ref 2.5–4.6)
Potassium: 3.5 mmol/L (ref 3.5–5.1)
Sodium: 143 mmol/L (ref 135–145)

## 2020-08-24 MED ORDER — DEXTROSE 5 % IV SOLN: INTRAVENOUS | Status: DC

## 2020-08-24 NOTE — Progress Notes (Signed)
Pt hadn't voided during this Rn's shift since 1900. A bladder scan was performed and found >453cc in bladder. An in and out cath was attempted but was unsuccessful. When the catheter was pulled out from vagina/ureathra area, there was stool in the tubing. Provider, Katsadouros, was notified. Provider called back and said he would discuss findings with the day team but to monitor pt at this time and update him of any status change.

## 2020-08-24 NOTE — Progress Notes (Signed)
   Subjective/Interm hx 1:1 sitter discontinued last evening. No reports of over activity last night.   Objective:  Vital signs in last 24 hours: Vitals:   08/22/20 2130 08/23/20 0452 08/23/20 1640 08/24/20 0421  BP: (!) 145/70 (!) 165/80 96/77 (!) 94/53  Pulse: 78 80 88 71  Resp: 19 19 20 18   Temp: 98 F (36.7 C) 98 F (36.7 C) 98 F (36.7 C) 98 F (36.7 C)  TempSrc: Oral Oral  Oral  SpO2: 97% 99% 100% 100%  Weight:       General: resting comfortably in bed HENT: dry MM Pulm: breathing comfortably on room air   Assessment/Plan:  Principal Problem:   Hypernatremia Active Problems:   Protein-calorie malnutrition, severe (HCC)   Hypokalemia   Dementia without behavioral disturbance (HCC)  End stage dementia, Goals of Care Hypovolemic hypernatremia. Fluids off since 08/22/20 however sodium crept back up this morning 141>143 and systolic pressure is down in the 90s again. Suspect this is all related to poor oral intake. -since GOC has not yet been established, will restart some gentle IVF with D5W @75cc /hr however this is not a long term solution -encourage oral hydration -appreciate goals of care discussion per palliative.   Best practice Code: DNR VTE prophylaxis: lovenox Dispo: medically stable for discharge in 0-1D. Will likely need to continue to work on transfer to [place] facility after returning to her skilled facility  , MD Internal Medicine Resident PGY-2 [place] [facility] Res[facility] Pager: [phone] After 5pm on weekdays and 1pm on weekends: On Call pager [phone] 08/24/2020 7:40 AM

## 2020-08-24 NOTE — Plan of Care (Signed)
  Problem: Health Behavior/Discharge Planning: Goal: Ability to manage health-related needs will improve Outcome: Progressing   Problem: Clinical Measurements: Goal: Ability to maintain clinical measurements within normal limits will improve Outcome: Progressing Goal: Will remain free from infection Outcome: Progressing Goal: Diagnostic test results will improve Outcome: Progressing Goal: Respiratory complications will improve Outcome: Progressing Goal: Cardiovascular complication will be avoided Outcome: Progressing   Problem: Activity: Goal: Risk for activity intolerance will decrease Outcome: Progressing   Problem: Nutrition: Goal: Adequate nutrition will be maintained Outcome: Progressing   Problem: Coping: Goal: Level of anxiety will decrease Outcome: Progressing   Problem: Pain Managment: Goal: General experience of comfort will improve Outcome: Progressing   Problem: Safety: Goal: Ability to remain free from injury will improve Outcome: Progressing   Problem: Skin Integrity: Goal: Risk for impaired skin integrity will decrease Outcome: Progressing   Problem: Elimination: Goal: Will not experience complications related to bowel motility Outcome: Not Progressing Goal: Will not experience complications related to urinary retention Outcome: Not Progressing   Problem: Education: Goal: Knowledge of General Education information will improve Description: Including pain rating scale, medication(s)/side effects and non-pharmacologic comfort measures Outcome: Not Met (add Reason)

## 2020-08-24 NOTE — Discharge Summary (Addendum)
Name: Christina Buck MRN: [id number] DOB: Jan 31, 1951 70 y.o. PCP: Selina Cooley, MD  Date of Admission: 08/19/2020  9:54 AM Date of Discharge: 08/25/20 Attending Physician: Inez Catalina, MD  Discharge Diagnosis Acute hypernatremia Acute Kidney Injury End stage dementia Severe protein calorie malnutrition Goals of Care  Discharge Medications: Allergies as of 08/25/2020   No Known Allergies      Medication List     STOP taking these medications    cephALEXin 250 MG capsule Commonly known as: KEFLEX   ferrous sulfate 325 (65 FE) MG EC tablet   furosemide 20 MG tablet Commonly known as: LASIX   rosuvastatin 10 MG tablet Commonly known as: CRESTOR   vitamin B-12 1000 MCG tablet Commonly known as: CYANOCOBALAMIN       TAKE these medications    acetaminophen 500 MG tablet Commonly known as: TYLENOL Take 500 mg by mouth every 6 (six) hours as needed for pain.   aspirin 81 MG chewable tablet Chew 81 mg by mouth daily.   divalproex 125 MG DR tablet Commonly known as: DEPAKOTE Take 125 mg by mouth at bedtime.   feeding supplement (PRO-STAT SUGAR FREE 64) Liqd Take 30 mLs by mouth 2 (two) times daily.   feeding supplement Liqd Take 237 mLs by mouth 2 (two) times daily between meals.   haloperidol 0.5 MG tablet Commonly known as: HALDOL Take 1 tablet (0.5 mg total) by mouth every 8 (eight) hours as needed for agitation.   hydrOXYzine 10 MG tablet Commonly known as: ATARAX/VISTARIL Take 10 mg by mouth every 8 (eight) hours as needed for anxiety.   melatonin 3 MG Tabs tablet Take 3 mg by mouth at bedtime.   omeprazole 20 MG capsule Commonly known as: PRILOSEC Take 20 mg by mouth daily.   potassium chloride 10 MEQ tablet Commonly known as: KLOR-CON Take 10 mEq by mouth daily.   sertraline 100 MG tablet Commonly known as: ZOLOFT Take 100 mg by mouth daily.   traZODone 50 MG tablet Commonly known as: DESYREL Take 50 mg by mouth at bedtime.         Disposition and follow-up:   Ms.Kelleen L Meneely was discharged from [facility] in Stable condition.  At the hospital follow up visit please address:  Acute Hypernatremia secondary to poor oral intake due to progression to end stage dementia.  Goals of care -referral placed to hospice at discharge, will follow with [facility]  Labs / imaging needed at time of follow-up: none  Pending labs/ test needing follow-up: none  Follow-up Appointments: n/a    Hospital Course:  Keina L Isidro is a 70 year old chronically ill female with severe dementia who presented to [facility] emergency department on 4/19 after sliding out of her chair and possibly falling at her skilled nursing facility.  Her son had noticed that she has had a progressive decline in overall health and mental status over the past year. Work-up in the ED revealed a serum sodium of 164, chloride 123, creatinine 1.57, BUN 58.  Brain imaging revealed significant atrophy but no acute findings.  Chest x-ray and pelvic x-ray did not reveal any acute findings as well. Work-up in the ED was consistent with a hypovolemic hypernatremia.  She was started on D5W and serum sodiums were monitored over the course of her hospitalization.  Sodium finally returned to normal values on hospital day 3.  AKI resolved.  She is evaluated by speech therapy and that to be able to tolerate  a dysphagia diet.  We did trial her off of the IV fluids for 24 hours and serum sodium began to increase again.  She had poor oral intake over this timeframe. Palliative care was consulted for goals of care discussion as it was felt that her diagnosis was strongly tied to progression to end-stage dementia.  Family transitioned her to DNR/DNI and elected to discharge with hospice.  Discharge Vitals:   BP 99/79 (BP Location: Right Arm)   Pulse 82   Temp 98.4 F (36.9 C) (Oral)   Resp 16   Wt 42.1 kg   SpO2 100%   BMI 15.44 kg/m    Pertinent Labs, Studies, and Procedures:  CBC Latest Ref Rng & Units 08/22/2020 08/21/2020 08/20/2020  WBC 4.0 - 10.5 K/uL 6.2 5.8 5.8  Hemoglobin 12.0 - 15.0 g/dL 11.0(L) 10.9(L) 11.6(L)  Hematocrit 36.0 - 46.0 % 35.5(L) 35.2(L) 39.5  Platelets 150 - 400 K/uL 206 203 204   BMP Latest Ref Rng & Units 08/25/2020 08/24/2020 08/23/2020  Glucose 70 - 99 mg/dL 768(T) 72 92  BUN 8 - 23 mg/dL 20 19 16   Creatinine 0.44 - 1.00 mg/dL 1.57 2.62  Sodium 135 - 145 mmol/L 141 143 141  Potassium 3.5 - 5.1 mmol/L 3.7 3.5 4.0  Chloride 98 - 111 mmol/L 107 110 111  CO2 22 - 32 mmol/L 27 24 24   Calcium 8.9 - 10.3 mg/dL 9.0 8.9 0.35)    Discharge Instructions: Discharge Instructions     Discharge diet:   Complete by: As directed    DYSPHAGIA 1       Signed:  , MD Internal Medicine Resident PGY-2 5.9(R Internal Medicine Residency Pager: [phone] 08/25/2020 12:33 PM

## 2020-08-24 NOTE — TOC Progression Note (Signed)
Transition of Care [facility]) - Progression Note    Patient Details  Name: Christina Buck MRN: [id number] Date of Birth: Mar 24, 1951  Transition of Care [facility]) CM/SW Contact  Carley Hammed, [place] Phone Number: 08/24/2020, 10:56 AM  Clinical Narrative:     CSW spoke with son, Christina Buck, who noted that the family was interested in Hospice for pt. They are agreeable to [facility] and referral was made. CSW discussed DC options and family noted they would be ok with pt returning to [facility] if no other option could be found. Eddie noted that weekday CSW may have options for them, CSW will defer. [facility] does not admit over the weekend. SW will continue to follow.  Expected Discharge Plan: Assisted Living Barriers to Discharge: Continued Medical Work up  Expected Discharge Plan and Services Expected Discharge Plan: Assisted Living In-house Referral: Clinical Social Work   Post Acute Care Choice:  (choice still pending) Living arrangements for the past 2 months: Assisted Living Facility                                       Social Determinants of Health (SDOH) Interventions    Readmission Risk Interventions No flowsheet data found.

## 2020-08-24 NOTE — Progress Notes (Signed)
                                                                                                                                                                                                         Daily Progress Note   Patient Name: Christina Buck       Date: 08/24/2020 DOB: 12/24/1950  Age: 70 y.o. MRN#: [id number] Attending Physician: Mullen, Emily B, MD Primary Care Physician: Witten, Bobby, MD Admit Date: 08/19/2020  Reason for Consultation/Follow-up: Establishing goals of care "Patient is with end stage dementia, have spoken to family and they are open to persuing comfort care"  Subjective: 14:55--Patient is OOB to recliner, 2 RNs are trying to stop her from pulling her IV out. Bilateral mittens in use. RN reports patient has been combative.   18:30--Spoke with sons Eddie and [name] by phone (separately). Eddie has reviewed the MOST form I sent to him by email and has discussed with [name]. He reports [name] is coming to the hospital this evening and can complete the form in person. Confirmed with [name] that he is coming to the hospital and made plans to meet in the room.  Discussed with both sons that patient has been agitated and may benefit from medication to alleviate this symptom. Both sons are in agreement with this plan of care.   19:50--Met with son/[name] at bedside. We completed a MOST form together. The family outlined their wishes for the following treatment decisions:  Cardiopulmonary Resuscitation: Do Not Attempt Resuscitation (DNR/No CPR)  Medical Interventions: Comfort Measures: Keep clean, warm, and dry. Use medication by any route, positioning, wound care, and other measures to relieve pain and suffering. Use oxygen, suction and manual treatment of airway obstruction as needed for comfort. Do not transfer to the hospital unless comfort needs cannot be met in current location.  Antibiotics: Antibiotics if indicated  IV Fluids: IV fluids for a defined trial period   Feeding Tube: No feeding tube     Length of Stay: 5  Current Medications: Scheduled Meds:  . (feeding supplement) PROSource Plus  30 mL Oral BID BM  . enoxaparin (LOVENOX) injection  40 mg Subcutaneous Q24H  . feeding supplement  1 Container Oral TID BM  . melatonin  3 mg Oral QHS  . potassium chloride  40 mEq Oral Daily    Continuous Infusions: . dextrose      PRN Meds: acetaminophen **OR** acetaminophen, loperamide HCl  Physical Exam Vitals reviewed.  Constitutional:      General: She is not in acute distress.      Comments: Appears chronically ill and frail  Pulmonary:     Effort: Pulmonary effort is normal.  Neurological:     Mental Status: She is alert.     Motor: Weakness present.  Psychiatric:        Behavior: Behavior is agitated.        Cognition and Memory: Cognition is impaired.             Vital Signs: BP (!) 94/53 (BP Location: Left Leg)   Pulse 71   Temp 98 F (36.7 C) (Oral)   Resp 18   Wt 42.1 kg   SpO2 100%   BMI 15.44 kg/m  SpO2: SpO2: 100 % O2 Device: O2 Device: Room Air  Intake/output summary:   Intake/Output Summary (Last 24 hours) at 08/24/2020 1457 Last data filed at 08/23/2020 1945 Gross per 24 hour  Intake 240 ml  Output --  Net 240 ml   LBM: Last BM Date: 08/22/20 Baseline Weight: Weight: 42.1 kg Most recent weight: Weight: 42.1 kg       Palliative Assessment/Data: PPS 40%         Palliative Care Assessment & Plan   HPI/Patient Profile: 70 y.o. female  with past medical history of dementia, severe protein calorie malnutrition, anxiety, GERD, and arthritis who presented to the emergency department on 08/19/2020 after having a fall. CT of head and spine negative for acute traumatic injury; did reveal severe cerebral and mild cerebellar atrophy with ex vacuo dilation of ventricular system and extensive chronic microvascular changes. Patient was found to have severe hypernatremia (sodium 164) and AKI suggestive of severe  dehydration. Admitted to IMTS.    Assessment: - severe hypernatremia - hyperchloremia - AKI - end stage dementia - anxiety - UTI - severe protein malnutrition   Recommendations/Plan:  DNR/DNI as previously documented  Continue current medical care  MOST form completed - original placed on shadow chart and copy made to scan into [facility]  Haldol oral tablet 0.5 mg every 8 hours PRN agitation (recommend continuing at discharge)  Plan is for hospice care when patient is discharged back to SNF - referral is pending  Ultimate goal is to have patient transferred to a facility in [place] (closer to [place]) - appreciate LCSW for speaking with family regarding this matter   Goals of Care and Additional Recommendations:  Limitations on Scope of Treatment: No Artificial Feeding  Code Status: DNR/DNI  Prognosis:   < 6 months  Discharge Planning:  [place] with Hospice    Thank you for allowing the Palliative Medicine Team to assist in the care of this patient.   Total Time 35 minutes Prolonged Time Billed  no       Greater than 50%  of this time was spent counseling and coordinating care related to the above assessment and plan.  Lavena Bullion, NP  Please contact Palliative Medicine Team phone at [phone] for questions and concerns.

## 2020-08-25 LAB — RENAL FUNCTION PANEL
Albumin: 3.1 g/dL — ABNORMAL LOW (ref 3.5–5.0)
Anion gap: 7 (ref 5–15)
BUN: 20 mg/dL (ref 8–23)
CO2: 27 mmol/L (ref 22–32)
Calcium: 9 mg/dL (ref 8.9–10.3)
Chloride: 107 mmol/L (ref 98–111)
Creatinine, Ser: 0.71 mg/dL (ref 0.44–1.00)
GFR, Estimated: >60 mL/min (ref >60)
Glucose, Bld: 116 mg/dL — ABNORMAL HIGH (ref 70–99)
Phosphorus: 3.2 mg/dL (ref 2.5–4.6)
Potassium: 3.7 mmol/L (ref 3.5–5.1)
Sodium: 141 mmol/L (ref 135–145)

## 2020-08-25 MED ORDER — HALOPERIDOL 1 MG PO TABS: 0.5000 mg | ORAL_TABLET | Freq: Three times a day (TID) | ORAL | Status: DC | PRN

## 2020-08-25 MED ORDER — HALOPERIDOL 0.5 MG PO TABS
0.5000 mg | ORAL_TABLET | Freq: Three times a day (TID) | ORAL | Status: AC | PRN
Start: 2020-08-25 — End: ?

## 2020-08-25 MED ORDER — TRAZODONE HCL 50 MG PO TABS
50.0000 mg | ORAL_TABLET | Freq: Once | ORAL | Status: AC
  Administered 2020-08-25: 50 mg via ORAL
  Filled 2020-08-25: qty 1

## 2020-08-25 NOTE — TOC Progression Note (Addendum)
Transition of Care [facility]) - Progression Note    Patient Details  Name: Christina Buck MRN: [id number] Date of Birth: Jun 25, 1950  Transition [facility] Care [facility]) CM/SW Contact  Lorri Frederick, LCSW Phone Number: 08/25/2020, 1:46 PM  Clinical Narrative:   CSW spoke with pt son Christina Buck.  Discussed referral to hospice, discussed choice, [name] OK with referral to [facility].  Discussed pt ready for return to [facility].  Christina Buck still interested in pt going to a different facility.  CSW discussed list of ALF that CSW can provide, Christina Buck will be at the hospital at 2pm and we will meet at that point.  [name] aware that pt will return to [facility] and he can pursue transition to new facility.  CSW spoke with [name] at [facility] and they are ready to receive pt today, covid test not needed.   1545: CSW spoke with [name] at [facility] and they are in need of a hospital bed and wheelchair.  CSW spoke with [name] at [facility], they have not been able to speak with son yet, will not be able to get hospital bed delivered today.  MD informed.      Expected Discharge Plan: Assisted Living Barriers to Discharge: Continued Medical Work up  Expected Discharge Plan and Services Expected Discharge Plan: Assisted Living In-house Referral: Clinical Social Work   Post Acute Care Choice:  (choice still pending) Living arrangements for the past 2 months: Assisted Living Facility Expected Discharge Date: 08/25/20                                     Social Determinants of Health (SDOH) Interventions    Readmission Risk Interventions No flowsheet data found.

## 2020-08-25 NOTE — Plan of Care (Signed)
  Problem: Clinical Measurements: Goal: Ability to maintain clinical measurements within normal limits will improve Outcome: Progressing Goal: Will remain free from infection Outcome: Progressing Goal: Diagnostic test results will improve Outcome: Progressing Goal: Respiratory complications will improve Outcome: Progressing Goal: Cardiovascular complication will be avoided Outcome: Progressing   Problem: Activity: Goal: Risk for activity intolerance will decrease Outcome: Progressing   Problem: Nutrition: Goal: Adequate nutrition will be maintained Outcome: Progressing   Problem: Coping: Goal: Level of anxiety will decrease Outcome: Progressing   Problem: Elimination: Goal: Will not experience complications related to bowel motility Outcome: Progressing Goal: Will not experience complications related to urinary retention Outcome: Progressing   Problem: Pain Managment: Goal: General experience of comfort will improve Outcome: Progressing   Problem: Safety: Goal: Ability to remain free from injury will improve Outcome: Progressing   Problem: Skin Integrity: Goal: Risk for impaired skin integrity will decrease Outcome: Progressing   Problem: Education: Goal: Knowledge of General Education information will improve Description: Including pain rating scale, medication(s)/side effects and non-pharmacologic comfort measures Outcome: Not Met (add Reason)   Problem: Health Behavior/Discharge Planning: Goal: Ability to manage health-related needs will improve Outcome: Not Met (add Reason)

## 2020-08-25 NOTE — NC FL2 (Signed)
[place] MEDICAID FL2 LEVEL OF CARE SCREENING TOOL     IDENTIFICATION  Patient Name: Christina Buck Birthdate: [birth year] Sex: female Admission Date (Current Location): 08/19/2020  [place] and [place] Number:  Haynes Bast [id number] O Facility and Address:  [facility], [address], [place], [place] [address]      Provider Number: [id number]  Attending Physician Name and Address:  Inez Catalina, MD  Relative Name and Phone Number:  [employer] [employer], [phone]    Current Level of Care: Hospital Recommended Level of Care: Assisted Living Facility Prior Approval Number:    Date Approved/Denied:   PASRR Number:    Discharge Plan: Other (Comment) (Assisted living)    Current Diagnoses: Patient Active Problem List   Diagnosis Date Noted  . Dementia without behavioral disturbance (HCC)   . COVID-19 virus infection 04/26/2019  . Acute respiratory failure with hypoxia (HCC) 04/26/2019  . Protein-calorie malnutrition, severe (HCC) 04/26/2019    Orientation RESPIRATION BLADDER Height & Weight     Self  Normal Incontinent Weight: 92 lb 12.8 oz (42.1 kg) Height:     BEHAVIORAL SYMPTOMS/MOOD NEUROLOGICAL BOWEL NUTRITION STATUS      Incontinent Diet (DYS 1.  See discharge summary)  AMBULATORY STATUS COMMUNICATION OF NEEDS Skin   Total Care Does not communicate Normal                       Personal Care Assistance Level of Assistance  Bathing,Feeding,Dressing Bathing Assistance: Maximum assistance Feeding assistance: Maximum assistance Dressing Assistance: Maximum assistance     Functional Limitations Info  Sight,Hearing,Speech Sight Info: Adequate Hearing Info: Adequate Speech Info: Impaired    SPECIAL CARE FACTORS FREQUENCY                       Contractures Contractures Info: Not present    Additional Factors Info  Code Status,Allergies Code Status Info: DNR Allergies Info: NKA           Current Medications  (08/25/2020):  This is the current hospital active medication list Current Facility-Administered Medications  Medication Dose Route Frequency Provider Last Rate Last Admin  . (feeding supplement) PROSource Plus liquid 30 mL  30 mL Oral BID BM Debe Coder B, MD   30 mL at 08/25/20 1328  . acetaminophen (TYLENOL) tablet 650 mg  650 mg Oral Q6H PRN [name], MD   650 mg at 08/22/20 2134   Or  . acetaminophen (TYLENOL) suppository 650 mg  650 mg Rectal Q6H PRN Christian, Rylee, MD      . enoxaparin (LOVENOX) injection 40 mg  40 mg Subcutaneous Q24H Christian, Rylee, MD   40 mg at 08/24/20 2054  . feeding supplement (BOOST / RESOURCE BREEZE) liquid 1 Container  1 Container Oral TID BM Inez Catalina, MD   1 Container at 08/25/20 1328  . haloperidol (HALDOL) tablet 0.5 mg  0.5 mg Oral Q8H PRN Sherlean Foot B, NP      . loperamide HCl (IMODIUM) 1 MG/7.5ML suspension 2 mg  2 mg Oral PRN Pham, Minh Q, RPH-CPP   2 mg at 08/22/20 1457  . melatonin tablet 3 mg  3 mg Oral QHS Masoudi, Elhamalsadat, MD   3 mg at 08/24/20 2054  . potassium chloride (KLOR-CON) packet 40 mEq  40 mEq Oral Daily Ephriam Knuckles, Rylee, MD   40 mEq at 08/25/20 1012     Discharge Medications: Please see discharge summary for a list of discharge  medications.  Relevant Imaging Results:  Relevant Lab Results:   Additional Information SS# [id number]  Lorri Frederick, LCSW

## 2020-08-25 NOTE — Progress Notes (Addendum)
[facility] 2W03 [facility]) Hospital Liaison RN Note  Received request from [facility] for hospice services at [facility]F after discharge. Chart and patient information under review by [facility] physician. Hospice eligibility is confirmed.   Spoke with patient's son Jaynie Collins to initiate education related to hospice philosophy, services and team approach to care. Son, Jaynie Collins verbalized understanding of information given. Per discussion, the plan is for discharge back to facility by ambulance likely tomorrow 4/26.   Patient's son as well as facility request a hospital bed for patient with no rails, an over the bed table and wheelchair.  Patient does reside at [facility] and facility will be the contact for equipment delivery.   Please send signed DNR back to facility with patient. Please provide prescriptions at discharge as needed to ensure ongoing symptom management.   [facility] information and contact numbers given to [place]. Above information shared with TOC, Tammy Sours.   Please call with any Hospice related questions or concerns.   Thank you for the opportunity to participate in this patient's care.   [name], Charity fundraiser, BSN [employer] [phone]

## 2020-08-25 NOTE — Progress Notes (Addendum)
   Subjective/Interm hx Agitated overnight. Bedside RN reported pulling out multiple PIVs throughout yesterday. Pt appears comfortable this morning  Objective:  Vital signs in last 24 hours: Vitals:   08/23/20 0452 08/23/20 1640 08/24/20 0421 08/25/20 0019  BP: (!) 165/80 96/77 (!) 94/53 99/79  Pulse: 80 88 71 82  Resp: 19 20 18 16   Temp: 98 F (36.7 C) 98 F (36.7 C) 98 F (36.7 C) 98.4 F (36.9 C)  TempSrc: Oral  Oral Oral  SpO2: 99% 100% 100% 100%  Weight:       General: resting comfortably in bed Neuro: awake, smiling.  Assessment/Plan: 80 yof with end stage dementia who was admitted for hypovolemic hypernatremia which resolved with D5W. Oral intake is insufficient for nutritional and fluid needs due to progression to end stage dementia.  Palliative care was consulted for discussion on goals of care and family has elected to discharge back to a skilled facility with hospice. We are currently awaiting bed availability.  Principal Problem:   Hypernatremia Active Problems:   Protein-calorie malnutrition, severe (HCC)   Hypokalemia   Dementia without behavioral disturbance (HCC)  End stage dementia, Goals of Care -DNR -will defer further fluid repletion through IV -ok to discontinue PIV as it is agitating to her and plan is to discharge to hospice -will discontinue lab draws -continue home dose trazodone qHS  -appreciate palliative consult -will discharge to SNF with hospice through [facility] pending bed availability   Best practice Code: DNR VTE prophylaxis: lovenox Dispo: medically stable for discharge pending bed placement.  59, MD Internal Medicine Resident PGY-2 [name] Internal Medicine Residency Pager: [phone] After 5pm on weekdays and 1pm on weekends: On Call pager [phone] 08/25/2020 9:40 AM

## 2020-08-25 NOTE — Progress Notes (Signed)
This RN attempted to obtain VS. Pt would not stay still long enough for blood pressure to take and pt kept removed oxygen sensor. Will attempt to take vitals at a later time. Provider, K[place]ad[place]ros, notified.

## 2020-08-25 NOTE — Plan of Care (Signed)

## 2020-08-25 NOTE — Progress Notes (Signed)
Paged by Jesse Sans she was unable to get vital signs as patient was unable to sit still long enough, removing oxygen sensor.  Plan per day team was to give continuous IV fluids and remove sitter to see how patient tolerates without.  Per night RN, patient was removing all IV lines earlier today and removed 3 renal lines placed by IV team.  We will reorder nightly trazodone and see if this helps calm patient down so that nursing staff is able to check vital signs.  We will hold on fluids at this time and plan to reattempt tomorrow, she may need mittens during the day.

## 2020-08-26 LAB — CREATININE, SERUM
Creatinine, Ser: 0.63 mg/dL (ref 0.44–1.00)
GFR, Estimated: >60 mL/min (ref >60)

## 2020-08-26 NOTE — TOC Transition Note (Signed)
Transition of Care [facility]) - CM/SW Discharge Note   Patient Details  Name: Christina Buck MRN: [id number] Date of Birth: 1950-05-14  Transition of Care [facility]) CM/SW Contact:  Lorri Frederick, LCSW Phone Number: 08/26/2020, 11:12 AM   Clinical Narrative:   Pt discharging back to [facility].  RN call report to [phone].   CSW spoke with [name] at [facility], confirmed delivery of DME.  CSW spoke with pt son Jaynie Collins and confirmed that will be calling [facility] to transfer back.  He is planning to meet pt at [facility].       Final next level of care: Assisted Living Barriers to Discharge: Barriers Resolved   Patient Goals and CMS Choice Patient states their goals for this hospitalization and ongoing recovery are:: TBD      Discharge Placement              Patient chooses bed at:  [facility]) Patient to be transferred to facility by: [facility] Name of family member notified: son Jaynie Collins Patient and family notified of of transfer: 08/26/20  Discharge Plan and Services In-house Referral: Clinical Social Work   Post Acute Care Choice:  (choice still pending)                    HH Arranged:  (Home Hospice) [facility] Agency: [facility] Date [facility] Agency Contacted: 08/25/20 Time HH Agency Contacted: 1000 Representative spoke with at [facility] Agency: [place]  Social Determinants of Health (SDOH) Interventions     Readmission Risk Interventions No flowsheet data found.

## 2020-08-26 NOTE — Care Management Important Message (Signed)
Important Message  Patient Details  Name: Christina Buck MRN: [id number] Date of Birth: [birth year]   Medicare Important Message Given:  Yes     Vergene Marland Stefan Church 08/26/2020, 3:18 PM

## 2020-08-26 NOTE — Progress Notes (Addendum)
   No overnight event  Patient is seen at bedside.  She appears comfortable and in no acute distress.  She denies any complaints.  Her rehab facility confirmed delivery of DME and [facility] is ready to pick up patient today.  Doran Stabler, DO 08/26/2020, 7:10 AM Pager: [phone]    Internal Medicine Attending:   I saw and examined the patient. I reviewed the resident's note and I agree with the resident's findings and plan as documented in the resident's note.  Patient was discharged yesterday to SNF, but has to stay in hospital one additional night while the SNF arranged for a hospital bed. She remains stable for discharge today. No charge encounter today.  Erlinda Hong, MD
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
## 2020-08-26 NOTE — Plan of Care (Signed)
  Problem: Clinical Measurements: Goal: Ability to maintain clinical measurements within normal limits will improve Outcome: Progressing Goal: Will remain free from infection Outcome: Progressing Goal: Diagnostic test results will improve Outcome: Progressing Goal: Respiratory complications will improve Outcome: Progressing Goal: Cardiovascular complication will be avoided Outcome: Progressing   Problem: Activity: Goal: Risk for activity intolerance will decrease Outcome: Progressing   Problem: Nutrition: Goal: Adequate nutrition will be maintained Outcome: Progressing   Problem: Coping: Goal: Level of anxiety will decrease Outcome: Progressing   Problem: Elimination: Goal: Will not experience complications related to bowel motility Outcome: Progressing Goal: Will not experience complications related to urinary retention Outcome: Progressing   Problem: Pain Managment: Goal: General experience of comfort will improve Outcome: Progressing   Problem: Safety: Goal: Ability to remain free from injury will improve Outcome: Progressing   Problem: Skin Integrity: Goal: Risk for impaired skin integrity will decrease Outcome: Progressing   Problem: Education: Goal: Knowledge of General Education information will improve Description: Including pain rating scale, medication(s)/side effects and non-pharmacologic comfort measures Outcome: Not Met (add Reason)   Problem: Health Behavior/Discharge Planning: Goal: Ability to manage health-related needs will improve Outcome: Not Met (add Reason)

## 2020-10-14 ENCOUNTER — Emergency Department (HOSPITAL_COMMUNITY)

## 2020-10-14 ENCOUNTER — Emergency Department (HOSPITAL_COMMUNITY)
Admission: EM | Admit: 2020-10-14 | Discharge: 2020-10-15 | Disposition: A | Discharge status: Home / Self Care | Attending: Emergency Medicine | Admitting: Emergency Medicine

## 2020-10-14 ENCOUNTER — Other Ambulatory Visit: Payer: Self-pay

## 2020-10-14 DIAGNOSIS — F17210 Nicotine dependence, cigarettes, uncomplicated: Secondary | ICD-10-CM | POA: Insufficient documentation

## 2020-10-14 DIAGNOSIS — Z8616 Personal history of COVID-19: Secondary | ICD-10-CM | POA: Insufficient documentation

## 2020-10-14 DIAGNOSIS — S0083XA Contusion of other part of head, initial encounter: Secondary | ICD-10-CM | POA: Diagnosis not present

## 2020-10-14 DIAGNOSIS — Z7982 Long term (current) use of aspirin: Secondary | ICD-10-CM | POA: Insufficient documentation

## 2020-10-14 DIAGNOSIS — F0390 Unspecified dementia without behavioral disturbance: Secondary | ICD-10-CM | POA: Diagnosis not present

## 2020-10-14 DIAGNOSIS — W19XXXA Unspecified fall, initial encounter: Secondary | ICD-10-CM | POA: Diagnosis not present

## 2020-10-14 DIAGNOSIS — S0990XA Unspecified injury of head, initial encounter: Secondary | ICD-10-CM | POA: Diagnosis present

## 2020-10-14 MED ORDER — TRAZODONE HCL 50 MG PO TABS
50.0000 mg | ORAL_TABLET | Freq: Every day | ORAL | Status: DC
  Administered 2020-10-14: 50 mg via ORAL
  Filled 2020-10-14: qty 1

## 2020-10-14 MED ORDER — HALOPERIDOL LACTATE 5 MG/ML IJ SOLN
5.0000 mg | Freq: Once | INTRAMUSCULAR | Status: AC
  Administered 2020-10-14: 5 mg via INTRAMUSCULAR
  Filled 2020-10-14: qty 1

## 2020-10-14 NOTE — ED Provider Notes (Signed)
[facility] EMERGENCY DEPARTMENT Provider Note   CSN: [id number] Arrival date & time: 10/14/20  1852     History Chief Complaint  Patient presents with   Fall    Christina Buck is a 70 y.o. female.  Patient is a 70 year old female with a history of dementia who lives at [facility] also with a history of protein calorie malnutrition who is presenting today for evaluation after having a fall.  The fall occurred 3 days ago that was unwitnessed.  Patient has been her baseline self which is agitated, demented and primarily in a wheelchair.  When the physician saw her today he wanted her to be evaluated.  Staff did not report any change in behavior, vomiting or decreased oral intake.  There was no report of fevers.  Patient does not take anticoagulation.  The history is provided by the EMS personnel and medical records. The history is limited by the absence of a caregiver.  Fall This is a new problem. Episode onset: 3 days ago.      Past Medical History:  Diagnosis Date   Anxiety    Arthritis    Dementia [facility])     Patient Active Problem List   Diagnosis Date Noted   Dementia without behavioral disturbance (HCC)    COVID-19 virus infection 04/26/2019   Acute respiratory failure with hypoxia (HCC) 04/26/2019   Protein-calorie malnutrition, severe (HCC) 04/26/2019    No past surgical history on file.   OB History   No obstetric history on file.     No family history on file.  Social History   Tobacco Use   Smoking status: Every Day    Packs/day: 0.50    Years: 25.00    Pack years: 12.50    Types: Cigarettes   Smokeless tobacco: Never  Substance Use Topics   Alcohol use: Yes    Comment: occ   Drug use: No    Home Medications Prior to Admission medications   Medication Sig Start Date End Date Taking? Authorizing Provider  acetaminophen (TYLENOL) 500 MG tablet Take 500 mg by mouth every 6 (six) hours as needed for pain. 02/23/18    [provider]  Amino Acids-Protein Hydrolys (FEEDING SUPPLEMENT, PRO-STAT SUGAR FREE 64,) LIQD Take 30 mLs by mouth 2 (two) times daily. 05/01/19   Lorin Glass, MD  aspirin 81 MG chewable tablet Chew 81 mg by mouth daily.    [provider]  divalproex (DEPAKOTE) 125 MG DR tablet Take 125 mg by mouth at bedtime.    [provider]  feeding supplement, ENSURE ENLIVE, (ENSURE ENLIVE) LIQD Take 237 mLs by mouth 2 (two) times daily between meals. 05/01/19   Lorin Glass, MD  haloperidol (HALDOL) 0.5 MG tablet Take 1 tablet (0.5 mg total) by mouth every 8 (eight) hours as needed for agitation. 08/25/20   [name], MD  hydrOXYzine (ATARAX/VISTARIL) 10 MG tablet Take 10 mg by mouth every 8 (eight) hours as needed for anxiety.    [provider]  Melatonin 3 MG TABS Take 3 mg by mouth at bedtime. 09/06/18   [provider]  omeprazole (PRILOSEC) 20 MG capsule Take 20 mg by mouth daily.    [provider]  potassium chloride (KLOR-CON) 10 MEQ tablet Take 10 mEq by mouth daily. 08/31/18   [provider]  sertraline (ZOLOFT) 100 MG tablet Take 100 mg by mouth daily.    [provider]  traZODone (DESYREL) 50 MG tablet Take  50 mg by mouth at bedtime. 11/09/18   [provider]    Allergies    Patient has no known allergies.  Review of Systems   Review of Systems  All other systems reviewed and are negative.  Physical Exam Updated Vital Signs BP (!) 92/55   Pulse (!) 135   Temp 98.3 F (36.8 C) (Axillary)   Resp 18   Ht 5\' 5"  (1.651 m)   Wt 42.1 kg   SpO2 (!) 55%   BMI 15.45 kg/m   Physical Exam Vitals and nursing note reviewed.  Constitutional:      General: She is not in acute distress.    Appearance: She is well-developed.     Comments: Slightly agitated but distractible.  HENT:     Head: Normocephalic.      Mouth/Throat:     Mouth: Mucous membranes are moist.  Eyes:     Conjunctiva/sclera:      Left eye: Left conjunctiva is injected. No exudate or hemorrhage.    Pupils: Pupils are equal, round, and reactive to light.  Cardiovascular:     Rate and Rhythm: Normal rate and regular rhythm.     Heart sounds: Normal heart sounds. No murmur heard.   No friction rub.  Pulmonary:     Effort: Pulmonary effort is normal.     Breath sounds: Normal breath sounds. No wheezing or rales.  Abdominal:     General: Bowel sounds are normal. There is no distension.     Palpations: Abdomen is soft.     Tenderness: There is no abdominal tenderness. There is no guarding or rebound.  Musculoskeletal:        General: No tenderness. Normal range of motion.     Comments: No edema  Skin:    General: Skin is warm and dry.     Findings: No rash.  Neurological:     Mental Status: She is alert.     Cranial Nerves: No cranial nerve deficit.     Comments: Will answer yes and no occasionally.  Moving all extremities.  Continually fidgeting with the buckle on the EMS stretcher.  Psychiatric:     Comments: Agitated, demented    ED Results / Procedures / Treatments   Labs (all labs ordered are listed, but only abnormal results are displayed) Labs Reviewed - No data to display  EKG None  Radiology CT Head Wo Contrast  Result Date: 10/14/2020 CLINICAL DATA:  Head trauma EXAM: CT HEAD WITHOUT CONTRAST TECHNIQUE: Contiguous axial images were obtained from the base of the skull through the vertex without intravenous contrast. COMPARISON:  CT brain 08/19/2020 FINDINGS: Brain: No acute territorial infarction, hemorrhage or intracranial mass. Advanced atrophy and severe chronic small vessel ischemic change of the white matter. Stable ventricular enlargement. Vascular: No hyperdense vessels.  Carotid vascular calcification Skull: Normal. Negative for fracture or focal lesion. Sinuses/Orbits: No acute finding. Other: Moderate left forehead scalp hematoma IMPRESSION: 1. No definite CT evidence for acute intracranial  abnormality. 2. Advanced atrophy and chronic small vessel ischemic changes of the white matter 3. Left forehead scalp hematoma Electronically Signed   By: 08/21/2020 M.D.   On: 10/14/2020 22:09    Procedures Procedures   Medications Ordered in ED Medications - No data to display  ED Course  I have reviewed the triage vital signs and the nursing notes.  Pertinent labs & imaging results that were available during my care of the patient were reviewed by me and considered in  my medical decision making (see chart for details).    MDM Rules/Calculators/A&P                          Elderly female presenting today for evaluation of a fall that occurred 3 days ago.  There have been no other falls since that time.  She does have hematoma to the left forehead and cheek bone.  Mild injection of the left conjunctive a but normal reactivity of the pupil and no extraocular movement deficits.  Patient is continually agitated and fidgeting with anything in the bed and any cords that are attached to her.  When held still her heart rate and oxygen saturations are normal.  CT of the head to confirm no intracranial injury.  No history of anticoagulation.  No report from facility for any change in behavior.  10:13 PM CT neg at this time.  Pt will be d/ced back home to [facility].  MDM   Amount and/or Complexity of Data Reviewed Tests in the radiology section of CPT: ordered and reviewed Independent visualization of images, tracings, or specimens: yes     Final Clinical Impression(s) / ED Diagnoses Final diagnoses:  Fall, initial encounter  Contusion of face, initial encounter    Rx / DC Orders ED Discharge Orders     None        Gwyneth Sprout, MD 10/14/20 2214
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
## 2020-10-16 ENCOUNTER — Encounter (HOSPITAL_COMMUNITY): Payer: Self-pay

## 2020-10-16 ENCOUNTER — Emergency Department (HOSPITAL_COMMUNITY)
Admission: EM | Admit: 2020-10-16 | Discharge: 2020-10-16 | Disposition: A | Discharge status: Home / Self Care | Attending: Emergency Medicine | Admitting: Emergency Medicine

## 2020-10-16 ENCOUNTER — Emergency Department (HOSPITAL_COMMUNITY)

## 2020-10-16 DIAGNOSIS — F0390 Unspecified dementia without behavioral disturbance: Secondary | ICD-10-CM | POA: Diagnosis not present

## 2020-10-16 DIAGNOSIS — Z7982 Long term (current) use of aspirin: Secondary | ICD-10-CM | POA: Diagnosis not present

## 2020-10-16 DIAGNOSIS — Z8616 Personal history of COVID-19: Secondary | ICD-10-CM | POA: Diagnosis not present

## 2020-10-16 DIAGNOSIS — S0083XA Contusion of other part of head, initial encounter: Secondary | ICD-10-CM | POA: Diagnosis not present

## 2020-10-16 DIAGNOSIS — F17210 Nicotine dependence, cigarettes, uncomplicated: Secondary | ICD-10-CM | POA: Insufficient documentation

## 2020-10-16 DIAGNOSIS — S0240FA Zygomatic fracture, left side, initial encounter for closed fracture: Secondary | ICD-10-CM | POA: Insufficient documentation

## 2020-10-16 DIAGNOSIS — Z79899 Other long term (current) drug therapy: Secondary | ICD-10-CM | POA: Insufficient documentation

## 2020-10-16 DIAGNOSIS — W19XXXA Unspecified fall, initial encounter: Secondary | ICD-10-CM

## 2020-10-16 DIAGNOSIS — S0240DA Maxillary fracture, left side, initial encounter for closed fracture: Secondary | ICD-10-CM | POA: Insufficient documentation

## 2020-10-16 DIAGNOSIS — S0992XA Unspecified injury of nose, initial encounter: Secondary | ICD-10-CM | POA: Diagnosis present

## 2020-10-16 DIAGNOSIS — R040 Epistaxis: Secondary | ICD-10-CM | POA: Diagnosis not present

## 2020-10-16 MED ORDER — SILVER NITRATE-POT NITRATE 75-25 % EX MISC
1.0000 "application " | Freq: Once | CUTANEOUS | Status: AC
  Administered 2020-10-16: 1 via TOPICAL
  Filled 2020-10-16: qty 10

## 2020-10-16 NOTE — ED Notes (Signed)
Patient attempting to get out of bed. Bed alarm placed on bed and patient redirected.
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
## 2020-10-16 NOTE — ED Notes (Signed)
[facility] called to transport patient back to [facility]. Patient is an active hospice patient.

## 2020-10-16 NOTE — ED Notes (Signed)
Spoke with [facility] [phone]. Patients son and updated on plan of care.

## 2020-10-16 NOTE — Progress Notes (Signed)
[employer], [employer] [facility]) Hospital Liaison note.    This is a current patient with Physicist, medical Hospice at [facility]. [facility] hospital liaison will continue to follow for disposition.  Please do not hesitate to call with questions.    Thank you,   Elsie Saas, RN, [facility]      [facility] Liaison   [phone]
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
## 2020-10-16 NOTE — ED Notes (Signed)
Patient unable to sign MSE signature pad.   Per staff patient is at her normal baseline. Patient not answering questions.

## 2020-10-16 NOTE — ED Notes (Signed)
Report given to [facility]. They are aware she will be transported back to the facility. Discharge instructions given.

## 2020-10-18 ENCOUNTER — Emergency Department (HOSPITAL_COMMUNITY)
Admission: EM | Admit: 2020-10-18 | Discharge: 2020-10-18 | Disposition: A | Discharge status: Home / Self Care | Attending: Emergency Medicine | Admitting: Emergency Medicine

## 2020-10-18 ENCOUNTER — Other Ambulatory Visit: Payer: Self-pay

## 2020-10-18 ENCOUNTER — Emergency Department (HOSPITAL_COMMUNITY)

## 2020-10-18 DIAGNOSIS — Z7982 Long term (current) use of aspirin: Secondary | ICD-10-CM | POA: Insufficient documentation

## 2020-10-18 DIAGNOSIS — S0083XA Contusion of other part of head, initial encounter: Secondary | ICD-10-CM | POA: Diagnosis not present

## 2020-10-18 DIAGNOSIS — W228XXA Striking against or struck by other objects, initial encounter: Secondary | ICD-10-CM | POA: Diagnosis not present

## 2020-10-18 DIAGNOSIS — F0390 Unspecified dementia without behavioral disturbance: Secondary | ICD-10-CM | POA: Insufficient documentation

## 2020-10-18 DIAGNOSIS — F17210 Nicotine dependence, cigarettes, uncomplicated: Secondary | ICD-10-CM | POA: Insufficient documentation

## 2020-10-18 DIAGNOSIS — S60211A Contusion of right wrist, initial encounter: Secondary | ICD-10-CM | POA: Diagnosis not present

## 2020-10-18 DIAGNOSIS — Z8616 Personal history of COVID-19: Secondary | ICD-10-CM | POA: Insufficient documentation

## 2020-10-18 DIAGNOSIS — S6991XA Unspecified injury of right wrist, hand and finger(s), initial encounter: Secondary | ICD-10-CM | POA: Diagnosis present

## 2020-10-18 DIAGNOSIS — Z9181 History of falling: Secondary | ICD-10-CM
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
## 2020-10-18 NOTE — ED Notes (Signed)
Pt in bed resting and watching TV, bed alarm on , respirations even and unlabored
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
## 2020-10-18 NOTE — ED Provider Notes (Signed)
[facility]-EMERGENCY DEPT Provider Note   CSN: [id number] Arrival date & time: 10/18/20  1727     History No chief complaint on file.   Christina Buck is a 70 y.o. female.  HPI Patient presents for evaluation of a suspected injury to her right wrist, while at her facility today.  She was in the ED, 2 days ago with a fall and injured her face.  She was discharged after imaging.  She is unable to give any history because of her dementia.  Level 5 caveat-dementia    Past Medical History:  Diagnosis Date   Anxiety    Arthritis    Dementia [facility])     Patient Active Problem List   Diagnosis Date Noted   Dementia without behavioral disturbance (HCC)    COVID-19 virus infection 04/26/2019   Acute respiratory failure with hypoxia (HCC) 04/26/2019   Protein-calorie malnutrition, severe (HCC) 04/26/2019    No past surgical history on file.   OB History   No obstetric history on file.     No family history on file.  Social History   Tobacco Use   Smoking status: Every Day    Packs/day: 0.50    Years: 25.00    Pack years: 12.50    Types: Cigarettes   Smokeless tobacco: Never  Substance Use Topics   Alcohol use: Yes    Comment: occ   Drug use: No    Home Medications Prior to Admission medications   Medication Sig Start Date End Date Taking? Authorizing Provider  acetaminophen (TYLENOL) 500 MG tablet Take 500 mg by mouth every 6 (six) hours as needed for pain. 02/23/18   [provider]  Amino Acids-Protein Hydrolys (FEEDING SUPPLEMENT, PRO-STAT SUGAR FREE 64,) LIQD Take 30 mLs by mouth 2 (two) times daily. 05/01/19   Lorin Glass, MD  aspirin 81 MG chewable tablet Chew 81 mg by mouth daily.    [provider]  divalproex (DEPAKOTE) 125 MG DR tablet Take 125 mg by mouth at bedtime.    [provider]  feeding supplement, ENSURE ENLIVE, (ENSURE ENLIVE) LIQD Take 237 mLs by mouth 2 (two) times daily between meals.  05/01/19   Lorin Glass, MD  haloperidol (HALDOL) 0.5 MG tablet Take 1 tablet (0.5 mg total) by mouth every 8 (eight) hours as needed for agitation. 08/25/20   [name], MD  hydrOXYzine (ATARAX/VISTARIL) 10 MG tablet Take 10 mg by mouth every 8 (eight) hours as needed for anxiety.    [provider]  Melatonin 3 MG TABS Take 3 mg by mouth at bedtime. 09/06/18   [provider]  omeprazole (PRILOSEC) 20 MG capsule Take 20 mg by mouth daily.    [provider]  potassium chloride (KLOR-CON) 10 MEQ tablet Take 10 mEq by mouth daily. 08/31/18   [provider]  sertraline (ZOLOFT) 100 MG tablet Take 100 mg by mouth daily.    [provider]  traZODone (DESYREL) 50 MG tablet Take 50 mg by mouth at bedtime. 11/09/18   [provider]    Allergies    Patient has no known allergies.  Review of Systems   Review of Systems  All other systems reviewed and are negative.  Physical Exam Updated Vital Signs BP (!) 144/98 Comment: pt moving, not able to sit still  Pulse 67   Temp 99 F (37.2 C) (Oral)   Resp 18   Ht 5\' 5"  (1.651 m)   Wt 42.1 kg  SpO2 97%   BMI 15.45 kg/m   Physical Exam Vitals and nursing note reviewed.  Constitutional:      General: She is not in acute distress.    Appearance: She is well-developed. She is not ill-appearing or toxic-appearing.  HENT:     Head: Normocephalic and atraumatic.     Comments: Subacute injuries to the face including contusions and abrasions.  Small amount of dried blood from the nares.  No facial instability.    Right Ear: External ear normal.     Left Ear: External ear normal.  Eyes:     Conjunctiva/sclera: Conjunctivae normal.     Pupils: Pupils are equal, round, and reactive to light.  Neck:     Trachea: Phonation normal.  Cardiovascular:     Rate and Rhythm: Normal rate.  Pulmonary:     Effort: Pulmonary effort is normal.  Abdominal:     General: There is no distension.   Musculoskeletal:     Cervical back: Normal range of motion and neck supple.     Comments: She guards against movement of the right wrist secondary to pain.  There is mild swelling of the right wrist without skin break or deformity.  No other gross deformity of the left arm or legs.  Skin:    General: Skin is warm and dry.  Neurological:     Mental Status: She is alert and oriented to person, place, and time.     Cranial Nerves: No cranial nerve deficit.     Motor: No abnormal muscle tone.     Coordination: Coordination normal.  Psychiatric:        Attention and Perception: She is inattentive.        Speech: She is noncommunicative.        Behavior: Behavior is agitated.        Cognition and Memory: Cognition is impaired.    ED Results / Procedures / Treatments   Labs (all labs ordered are listed, but only abnormal results are displayed) Labs Reviewed - No data to display  EKG None  Radiology DG Wrist Complete Right  Result Date: 10/18/2020 CLINICAL DATA:  Wrist pain and swelling following getting wrist stuck in wheelchair, initial encounter EXAM: RIGHT WRIST - COMPLETE 3+ VIEW COMPARISON:  None. FINDINGS: There is no evidence of fracture or dislocation. There is no evidence of arthropathy or other focal bone abnormality. Soft tissues are unremarkable. IMPRESSION: No acute abnormality noted. Electronically Signed   By: Alcide Clever M.D.   On: 10/18/2020 18:36    Procedures Procedures   Medications Ordered in ED Medications - No data to display  ED Course  I have reviewed the triage vital signs and the nursing notes.  Pertinent labs & imaging results that were available during my care of the patient were reviewed by me and considered in my medical decision making (see chart for details).    MDM Rules/Calculators/A&P                           Patient Vitals for the past 24 hrs:  BP Temp Temp src Pulse Resp SpO2 Height Weight  10/18/20 1810 -- -- -- -- -- -- 5\' 5"  (1.651  m) 42.1 kg  10/18/20 1752 (!) 144/98 99 F (37.2 C) Oral 67 18 97 % -- --    6:50 PM Reevaluation with update and discussion. After initial assessment and treatment, an updated evaluation reveals no change in clinical status,  findings discussed and questions answered. Mancel Bale   Medical Decision Making:  This patient is presenting for evaluation of suspected right wrist injury, which does require a range of treatment options, and is a complaint that involves a moderate risk of morbidity and mortality. The differential diagnoses include contusion, fracture, skin soft tissue injury. I decided to review old records, and in summary elderly female, sitting in wheelchair today and apparently her wrist was pinched somehow.  She presents today after injuries 2 days ago in a fall.  She was comprehensively evaluated at that time and discharged back home.  I received additional historical information from her son Jaynie Collins.   Radiologic Tests Ordered, included x-ray right wrist.  I independently Visualized: Radiograph images, which show no fracture or dislocation    Critical Interventions-clinical evaluation, radiography, observation and reassessment  After These Interventions, the Patient was reevaluated and was found stable for discharge.  Symptomatic treatment indicated.  She has subacute injuries of the face, which appear to be healing without acute changes.  CRITICAL CARE-no Performed by: Mancel Bale  Nursing Notes Reviewed/ Care Coordinated Applicable Imaging Reviewed Interpretation of Laboratory Data incorporated into ED treatment  The patient appears reasonably screened and/or stabilized for discharge and I doubt any other medical condition or other [facility] requiring further screening, evaluation, or treatment in the ED at this time prior to discharge.  Plan: Home Medications-continue usual; Home Treatments-assistance when up, she is a fall risk; return here if the recommended treatment,  does not improve the symptoms; Recommended follow up-PCP, as needed     Final Clinical Impression(s) / ED Diagnoses Final diagnoses:  Contusion of right wrist, initial encounter  At high risk for falls    Rx / DC Orders ED Discharge Orders     None        Mancel Bale, MD 10/18/20 [phone]

## 2021-10-11 IMAGING — CT CT MAXILLOFACIAL W/O CM
3 of 4 series · 14 of 47 positions shown, 16 images · non-contrast
Comparison: Multiple previous CT exams, most recently 10/14/2020

CLINICAL DATA: Fall.  Patient unable to communicate.

EXAM:
CT HEAD WITHOUT CONTRAST
CT MAXILLOFACIAL WITHOUT CONTRAST
CT CERVICAL SPINE WITHOUT CONTRAST
TECHNIQUE: Multidetector CT imaging of the head, cervical spine, and
maxillofacial structures were performed using the standard protocol
without intravenous contrast. Multiplanar CT image reconstructions
of the cervical spine and maxillofacial structures were also
generated.

[Series 3: max soft · axial · 0.33mm/px · z∈[-747,-615]mm · 8 of 80 slices shown, 10 images]
[im 7/80  brain]
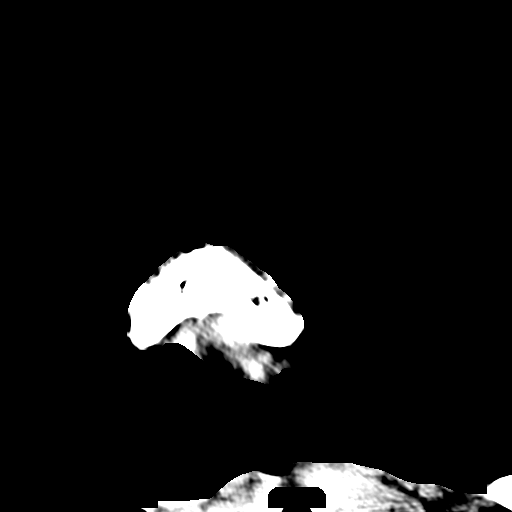
[im 7/80  bone]
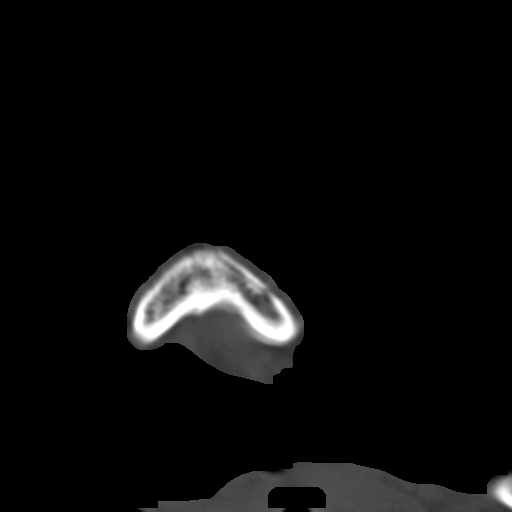
[im 16/80  bone]
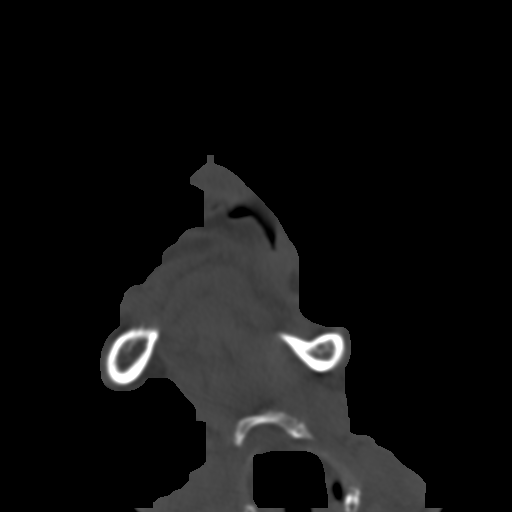
[im 25/80  bone]
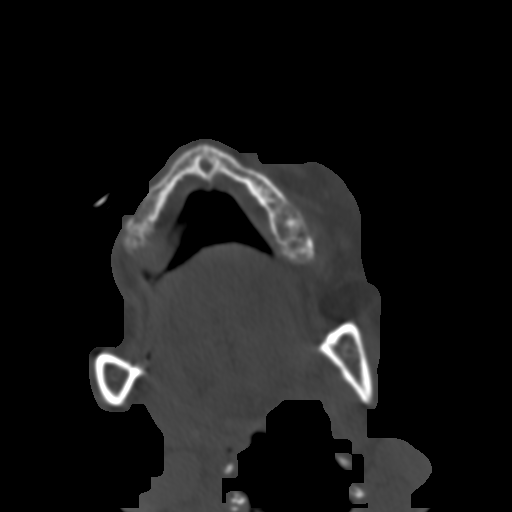
[im 34/80  bone]
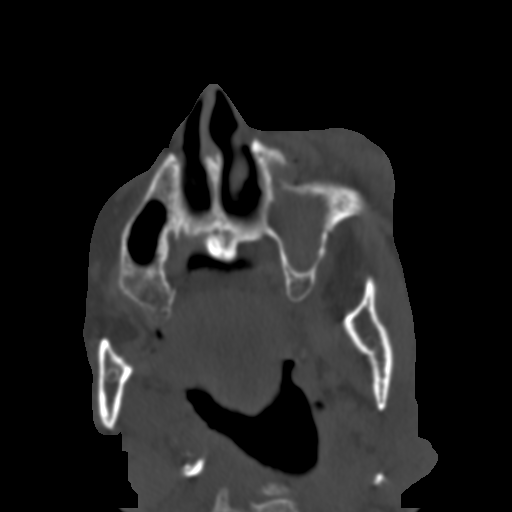
[im 46/80  brain]
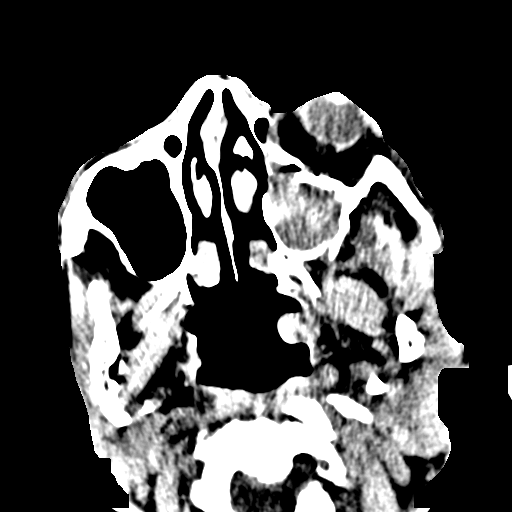
[im 46/80  bone]
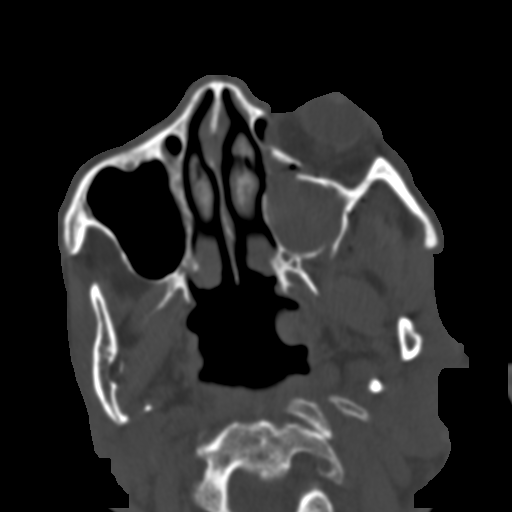
[im 55/80  bone]
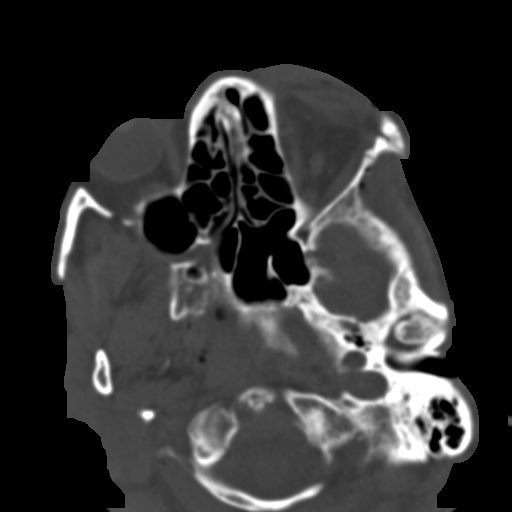
[im 64/80  bone]
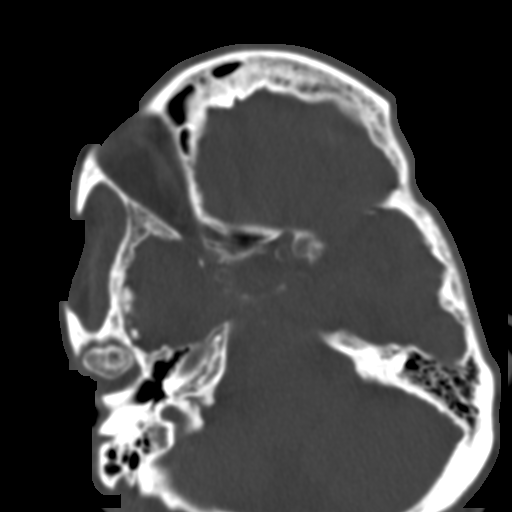
[im 73/80  bone]
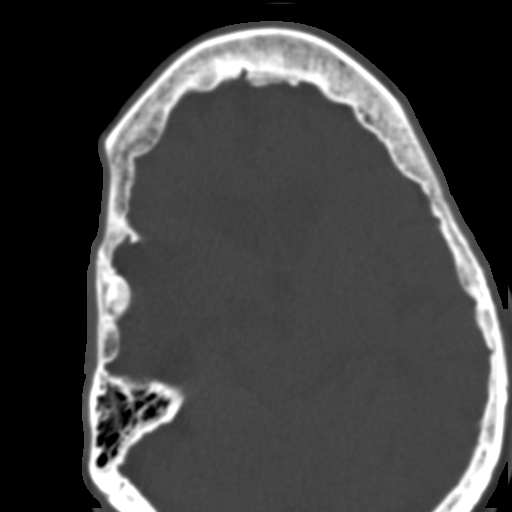

[Series 11: coronal soft · coronal · 0.33mm/px · 3 of 76 slices shown]
[im 26/76  bone]
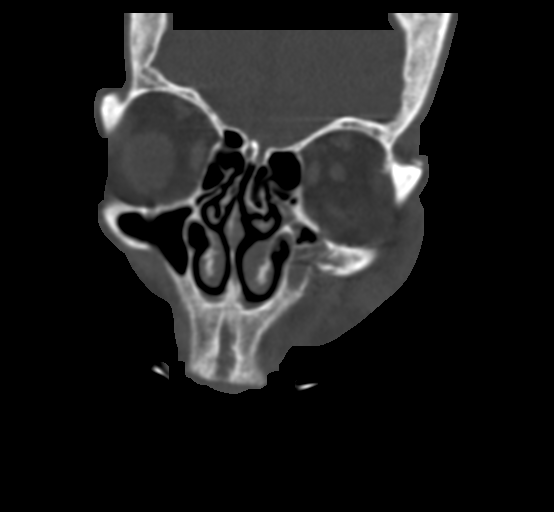
[im 34/76  bone]
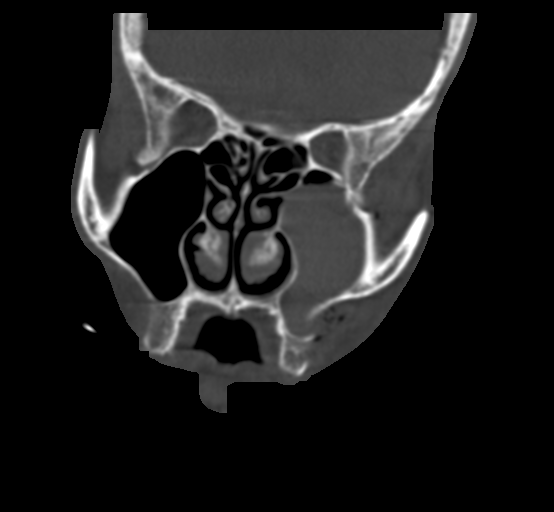
[im 42/76  bone]
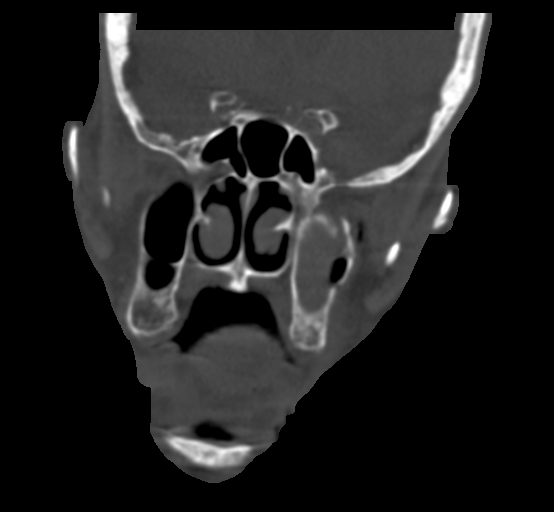

[Series 12: sagittal soft · sagittal · 0.30mm/px · 3 of 90 slices shown]
[im 30/90  bone]
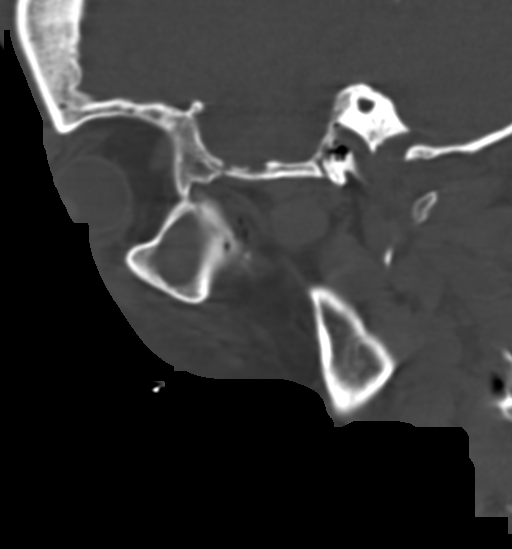
[im 45/90  bone]
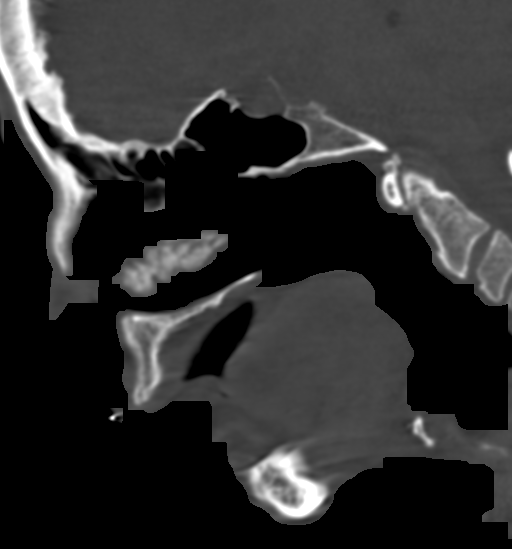
[im 60/90  bone]
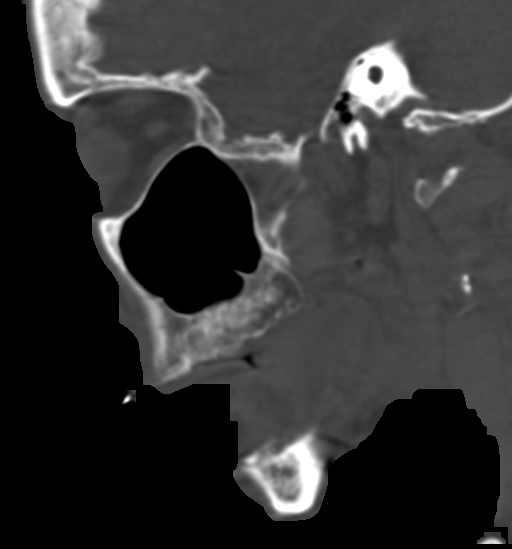

[14 of 47 positions shown; findings below may reference images not displayed]

FINDINGS: CT HEAD FINDINGS

Brain: No acute intracranial finding. Chronic encephalomalacia of
the right temporal lobe and insular region. Chronic small-vessel
ischemic changes throughout the white matter. No sign of acute
infarction, mass lesion, hemorrhage, hydrocephalus or extra-axial
collection

Vascular: There is atherosclerotic calcification of the major
vessels at the base of the brain.

Skull: No skull fracture.

Other: Left forehead scalp hematoma, similar to the study of 2 days
ago.

CT MAXILLOFACIAL FINDINGS

Osseous: Acute tripod type fracture of the zygoma on the left.
Fracture of floor of the orbit is comminuted and somewhat irregular
in there could be potential for trapping of the inferior rectus.
Fracture of the lateral wall of the maxillary sinus is impacted a
few mm. Fracture of the lateral wall of the orbit is comminuted but
not displaced. Fracture at the zygomaticofrontal suture region is
nondisplaced. The zygomatic arch itself is intact.

Orbits: As noted above, there would be some potential for trapping
of the inferior rectus based on the orbital floor fracture. There is
preseptal periorbital soft tissue swelling. No postseptal orbital
hematoma. No evidence of globe injury. Mild exophthalmos on the
left.

Sinuses: Blood opacification of the left maxillary sinus. Other
sinuses are clear.

Soft tissues: Hematoma of the left cheek.

CT CERVICAL SPINE FINDINGS

Alignment: No traumatic malalignment.

Skull base and vertebrae: No regional fracture.

Soft tissues and spinal canal: No soft tissue injury or mass lesion.

Disc levels: Mild spondylosis at C5-6 and C6-7, without severe canal
or foraminal narrowing due to osteophytes. Relative absence of facet
osteoarthritis.

Upper chest: Negative

Other: None
IMPRESSION: No acute intracranial finding. Atrophy and chronic small-vessel
ischemic changes. Chronic encephalomalacia in the right temporal
lobe and insular region.

No acute cervical spine finding.  Mild midcervical spondylosis.

Acute tripod type fracture of the left zygoma. Fracture of the
orbital floor is slightly displaced and there may be some risk of
trapping of the inferior rectus. Fracture of the zygomaticofrontal
suture. Zygomatic arch appears to remain intact.

## 2021-10-31 DEATH — deceased
